# Patient Record
Sex: Female | Born: 1985 | Race: Black or African American | Hispanic: No | Marital: Single | Smoking: Never smoker
Health system: Southern US, Community
[De-identification: ages and names within clinical notes are randomized; demographics above are authoritative.]

## PROBLEM LIST (undated history)

## (undated) ENCOUNTER — Emergency Department (HOSPITAL_COMMUNITY): Admission: EM | Payer: 59

## (undated) DIAGNOSIS — A048 Other specified bacterial intestinal infections: Secondary | ICD-10-CM

## (undated) DIAGNOSIS — K219 Gastro-esophageal reflux disease without esophagitis: Secondary | ICD-10-CM

## (undated) HISTORY — PX: OTHER SURGICAL HISTORY: SHX169

## (undated) HISTORY — DX: Gastro-esophageal reflux disease without esophagitis: K21.9

## (undated) HISTORY — DX: Other specified bacterial intestinal infections: A04.8

---

## 2010-12-11 ENCOUNTER — Telehealth: Payer: Self-pay | Admitting: Gastroenterology

## 2010-12-11 ENCOUNTER — Ambulatory Visit: Payer: Self-pay | Admitting: Gastroenterology

## 2010-12-15 NOTE — Telephone Encounter (Signed)
PT WAS A NO SHOW 

## 2010-12-17 NOTE — Telephone Encounter (Signed)
Please send letter to referring provider that pt did not show. Refer if necessary

## 2010-12-19 ENCOUNTER — Encounter: Payer: Self-pay | Admitting: Gastroenterology

## 2010-12-19 NOTE — Telephone Encounter (Signed)
Mailed appt card to patient. OV 12/19 @ 1030 with AS

## 2010-12-31 ENCOUNTER — Ambulatory Visit (INDEPENDENT_AMBULATORY_CARE_PROVIDER_SITE_OTHER): Payer: BC Managed Care – PPO | Admitting: Gastroenterology

## 2010-12-31 ENCOUNTER — Encounter: Payer: Self-pay | Admitting: Gastroenterology

## 2010-12-31 DIAGNOSIS — K5900 Constipation, unspecified: Secondary | ICD-10-CM | POA: Insufficient documentation

## 2010-12-31 MED ORDER — PEG-KCL-NACL-NASULF-NA ASC-C 100 G PO SOLR: 1.0000 | Freq: Once | ORAL | Status: DC

## 2010-12-31 NOTE — Assessment & Plan Note (Signed)
25 year old with chronic constipation X 3 years. Now reports lack of BM for 3 weeks. +flatus, physical exam with soft, benign abdomen. Likely constipation is multifactorial; no warning signs such as rectal bleeding, wt loss. Due to sexually active lifestyle without protection, will obtain baseline pregnancy test. If negative, proceed with bowel purge using 1/2 dose Moviprep. Repeat X 1 if necessary. Then, will start Amitiza as she has tried OTC agents without much result. Amitiza 8 mcg BID samples provided. Discussed with pt importance of pregnancy avoidance.  Obtain TSH, pregnancy screen Moviprep if negative for initial bowel purge Amitiza for management of constipation the day after bowel purge completed 3 mos f/u or sooner if necessary Obtain labs from [facility] for our records.

## 2010-12-31 NOTE — Progress Notes (Signed)
Primary Care Physician:  Ernestina Penna, MD Primary Gastroenterologist:  Dr. Darrick Penna  Chief Complaint  Patient presents with  . Constipation    has not use the bathroom in 3 wks    HPI:   Alexandra Castillo is a pleasant 25 year old female who is self-referred secondary to constipation. She reports chronic constipation for the past 3 years ago, around the time her baby was born. She states normally will take suppositories or enemas in the past with decent results. However, she has not been able to have a productive BM in 3 weeks. She did note 2 small hard balls the other day. Complains of lower abdominal and lower back discomfort. Denies any melena or hematochezia. Feels bloated and full. States she went to [facility] ED 3 mos ago with constipation. Xray performed, requested results. Denies any wt loss or lack of appetite. +flatus.  As of note, sexually active, does not use protection. Does have regular periods, the 6th of each month.    Past Medical History  Diagnosis Date  . Constipation     Past Surgical History  Procedure Date  . None     Current Outpatient Prescriptions  Medication Sig Dispense Refill  . peg 3350 powder (MOVIPREP) 100 G SOLR Take 1 kit (100 g total) by mouth once. As directed Please purchase 1 Fleets enema to use with the prep  1 kit  0    Allergies as of 12/31/2010  . (No Known Allergies)    Family History  Problem Relation Age of Onset  . Colon cancer Neg Hx     History   Social History  . Marital Status: Single    Spouse Name: N/A    Number of Children: N/A  . Years of Education: N/A   Occupational History  . Not on file.   Social History Main Topics  . Smoking status: Never Smoker   . Smokeless tobacco: Not on file  . Alcohol Use: Yes     socially  . Drug Use: No  . Sexually Active: Yes    Birth Control/ Protection: None     Review of Systems: Gen: Denies any fever, chills, fatigue, weight loss, lack of appetite.  CV: Denies chest pain,  heart palpitations, peripheral edema, syncope.  Resp: Denies shortness of breath at rest or with exertion. Denies wheezing or cough.  GI: Denies dysphagia or odynophagia. Denies jaundice, hematemesis, fecal incontinence. GU : Denies urinary burning, urinary frequency, urinary hesitancy MS: Denies joint pain, muscle weakness, cramps, or limitation of movement.  Derm: Denies rash, itching, dry skin Psych: Denies depression, anxiety, memory loss, and confusion Heme: Denies bruising, bleeding, and enlarged lymph nodes.  Physical Exam: BP 117/78  Pulse 84  Temp(Src) 98 F (36.7 C) (Temporal)  Ht 5\' 6"  (1.676 m)  Wt 198 lb 12.8 oz (90.175 kg)  BMI 32.09 kg/m2  LMP 12/18/2010 General:   Alert and oriented. Pleasant and cooperative. Well-nourished and well-developed.  Head:  Normocephalic and atraumatic. Eyes:  Without icterus, sclera clear and conjunctiva pink.  Ears:  Normal auditory acuity. Nose:  No deformity, discharge,  or lesions. Mouth:  No deformity or lesions, oral mucosa pink.  Neck:  Supple, without mass or thyromegaly. Lungs:  Clear to auscultation bilaterally. No wheezes, rales, or rhonchi. No distress.  Heart:  S1, S2 present without murmurs appreciated.  Abdomen:  +BS, soft, non-tender and non-distended. No HSM noted. No guarding or rebound. No masses appreciated.  Rectal:  Deferred  Msk:  Symmetrical without gross deformities. Normal  posture. Extremities:  Without clubbing or edema. Neurologic:  Alert and  oriented x4;  grossly normal neurologically. Skin:  Intact without significant lesions or rashes. Cervical Nodes:  No significant cervical adenopathy. Psych:  Alert and cooperative. Normal mood and affect.

## 2010-12-31 NOTE — Progress Notes (Signed)
Quick Note:  Negative pregnancy. Called pt to inform she may start the bowel purge. Left message. ______

## 2010-12-31 NOTE — Patient Instructions (Signed)
Please have labs drawn and urine pregnancy completed. DO NOT START ANY MEDICATIONS UNTIL WE CALL YOU WITH THESE RESULTS.   After we call you and give results, you may take a 1/2 dose of the prep. This means empty 1 packet of A and 1 packet of B into the 8oz container provided. Drink this afternoon/evening (after we say it's ok). If needed, you can repeat this once.  Leave a day in-between the prep and starting the new medication called Amitiza. This is twice a day, TAKEN WITH FOOD to avoid nausea. AVOID PREGNANCY, use protection.   We will see you back in 6 weeks or sooner if needed.   Please contact us if you do not have results or any issues with the prep.

## 2011-01-01 NOTE — Progress Notes (Signed)
REVIEWED.  

## 2011-01-01 NOTE — Progress Notes (Signed)
Spoke with pt. Bowel prep successful. No abdominal pain, N/V. Feels significantly improved. Starting Amitiza tomorrow. Follow-up as planned.

## 2011-01-01 NOTE — Progress Notes (Signed)
Quick Note:  Contacted pt again. She is aware. Excellent results with 1/2 dose moviprep. To begin Amitiza. ______

## 2011-01-01 NOTE — Progress Notes (Signed)
Cc to Dr. Ralph Dowdy

## 2011-02-11 ENCOUNTER — Ambulatory Visit: Payer: BC Managed Care – PPO | Admitting: Gastroenterology

## 2011-02-11 ENCOUNTER — Telehealth: Payer: Self-pay | Admitting: Gastroenterology

## 2011-02-11 NOTE — Telephone Encounter (Signed)
Pt was a no show

## 2011-05-14 NOTE — Telephone Encounter (Signed)
First no-show. Needs f/u appt for constipation.

## 2011-05-20 ENCOUNTER — Encounter: Payer: Self-pay | Admitting: Gastroenterology

## 2011-05-20 NOTE — Telephone Encounter (Signed)
Mailed letter to patient to call office to set up OV °

## 2016-03-07 ENCOUNTER — Encounter (HOSPITAL_COMMUNITY): Payer: Self-pay | Admitting: Emergency Medicine

## 2016-03-07 ENCOUNTER — Emergency Department (HOSPITAL_COMMUNITY)
Admission: EM | Admit: 2016-03-07 | Discharge: 2016-03-07 | Disposition: A | Discharge status: Home / Self Care | Payer: Medicaid Other | Attending: Emergency Medicine | Admitting: Emergency Medicine

## 2016-03-07 DIAGNOSIS — Z202 Contact with and (suspected) exposure to infections with a predominantly sexual mode of transmission: Secondary | ICD-10-CM | POA: Diagnosis not present

## 2016-03-07 DIAGNOSIS — B373 Candidiasis of vulva and vagina: Secondary | ICD-10-CM | POA: Diagnosis not present

## 2016-03-07 DIAGNOSIS — N898 Other specified noninflammatory disorders of vagina: Secondary | ICD-10-CM | POA: Diagnosis present

## 2016-03-07 DIAGNOSIS — B3731 Acute candidiasis of vulva and vagina: Secondary | ICD-10-CM

## 2016-03-07 LAB — URINALYSIS, ROUTINE W REFLEX MICROSCOPIC
BILIRUBIN URINE: NEGATIVE
Glucose, UA: NEGATIVE mg/dL
HGB URINE DIPSTICK: NEGATIVE
Ketones, ur: NEGATIVE mg/dL
Leukocytes, UA: NEGATIVE
NITRITE: NEGATIVE
PROTEIN: NEGATIVE mg/dL
Specific Gravity, Urine: 1.025 (ref 1.005–1.030)
pH: 7 (ref 5.0–8.0)

## 2016-03-07 LAB — WET PREP, GENITAL
Clue Cells Wet Prep HPF POC: NONE SEEN
SPERM: NONE SEEN
Trich, Wet Prep: NONE SEEN

## 2016-03-07 LAB — PREGNANCY, URINE: PREG TEST UR: NEGATIVE

## 2016-03-07 MED ORDER — TERCONAZOLE 0.4 % VA CREA: 1.0000 | TOPICAL_CREAM | Freq: Every day | VAGINAL | 0 refills | Status: DC

## 2016-03-07 MED ORDER — AZITHROMYCIN 250 MG PO TABS
1000.0000 mg | ORAL_TABLET | Freq: Once | ORAL | Status: AC
Start: 2016-03-07 — End: 2016-03-07
  Administered 2016-03-07: 1000 mg via ORAL
  Filled 2016-03-07: qty 4

## 2016-03-07 MED ORDER — CEFTRIAXONE SODIUM 250 MG IJ SOLR
250.0000 mg | Freq: Once | INTRAMUSCULAR | Status: AC
  Administered 2016-03-07: 250 mg via INTRAMUSCULAR
  Filled 2016-03-07: qty 250

## 2016-03-07 NOTE — ED Triage Notes (Signed)
Pt reports vaginal itching and rash x 1 month. States she went to PCP and was diagnosed with yeast infection. States she was unable to get prescription filled due to finances. States she also had unprotected sex about 3 weeks ago and is concerned about STDs.

## 2016-03-08 LAB — HIV ANTIBODY (ROUTINE TESTING W REFLEX): HIV Screen 4th Generation wRfx: NONREACTIVE

## 2016-03-08 LAB — RPR: RPR Ser Ql: NONREACTIVE

## 2016-03-09 LAB — GC/CHLAMYDIA PROBE AMP (~~LOC~~) NOT AT ARMC
CHLAMYDIA, DNA PROBE: NEGATIVE
NEISSERIA GONORRHEA: NEGATIVE

## 2016-03-09 NOTE — ED Provider Notes (Signed)
[facility]-EMERGENCY DEPT Provider Note   CSN: [id number] Arrival date & time: 03/07/16  [id number]     History   Chief Complaint Chief Complaint  Patient presents with  . Vaginal Itching    HPI Alexandra Castillo is a [age] y.o. female presenting with persistent vaginal itching with known yeast vaginitis which was diagnosed one month ago.  She was unable to afford the medicine prescribed so remained untreated.  She also endorses having a new female partner about 3 weeks ago, having unprotected sex and would like std screening.  She denies abdominal or pelvic pain, fevers, chills, rash, or any change in her vaginal discharge.  She has found no alleviators.  The history is provided by the patient.    Past Medical History:  Diagnosis Date  . Constipation     Patient Active Problem List   Diagnosis Date Noted  . Constipation 12/31/2010    Past Surgical History:  Procedure Laterality Date  . None      OB History    No data available       Home Medications    Prior to Admission medications   Medication Sig Start Date End Date Taking? Authorizing Provider  hydrocortisone cream 1 % Apply 1 application topically daily as needed for itching.   Yes Historical Provider, MD  terconazole (TERAZOL 7) 0.4 % vaginal cream Place 1 applicator vaginally at bedtime. 03/07/16   Burgess AmorJulie Kaleea Penner, PA-C    Family History Family History  Problem Relation Age of Onset  . Colon cancer Neg Hx     Social History Social History  Substance Use Topics  . Smoking status: Never Smoker  . Smokeless tobacco: Never Used  . Alcohol use Yes     Comment: socially     Allergies   Hydrocodeine [dihydrocodeine]   Review of Systems Review of Systems  Constitutional: Negative for fever.  HENT: Negative for congestion and sore throat.   Eyes: Negative.   Respiratory: Negative for chest tightness and shortness of breath.   Cardiovascular: Negative for chest pain.  Gastrointestinal: Negative for abdominal pain,  nausea and vomiting.  Genitourinary: Positive for vaginal discharge. Negative for dysuria, flank pain, genital sores, pelvic pain and vaginal pain.  Musculoskeletal: Negative for arthralgias, joint swelling and neck pain.  Skin: Negative.  Negative for rash and wound.  Neurological: Negative for dizziness, weakness, light-headedness, numbness and headaches.  Psychiatric/Behavioral: Negative.      Physical Exam Updated Vital Signs BP 125/77 (BP Location: Left Arm)   Pulse 101   Temp 98 F (36.7 C) (Oral)   Resp 21   Ht 5\' 6"  (1.676 m)   Wt 96.2 kg   LMP 02/11/2016   SpO2 100%   BMI 34.22 kg/m   Physical Exam  Constitutional: She appears well-developed and well-nourished.  HENT:  Head: Normocephalic and atraumatic.  Eyes: Conjunctivae are normal.  Neck: Normal range of motion.  Cardiovascular: Normal rate, regular rhythm, normal heart sounds and intact distal pulses.   Pulmonary/Chest: Effort normal and breath sounds normal. She has no wheezes.  Abdominal: Soft. Bowel sounds are normal. There is no tenderness. There is no guarding.  Genitourinary: There is rash and tenderness on the right labia. There is rash and tenderness on the left labia. Cervix exhibits no motion tenderness and no discharge. Right adnexum displays no mass and no tenderness. Left adnexum displays no mass and no tenderness. No erythema or tenderness in the vagina. Vaginal discharge found.  Musculoskeletal: Normal range of motion.  Neurological:  She is alert.  Skin: Skin is warm and dry.  Psychiatric: She has a normal mood and affect.  Nursing note and vitals reviewed.    ED Treatments / Results  Labs (all labs ordered are listed, but only abnormal results are displayed) Labs Reviewed  WET PREP, GENITAL - Abnormal; Notable for the following:       Result Value   Yeast Wet Prep HPF POC PRESENT (*)    WBC, Wet Prep HPF POC MODERATE (*)    All other components within normal limits  URINALYSIS, ROUTINE W  REFLEX MICROSCOPIC  PREGNANCY, URINE  HIV ANTIBODY (ROUTINE TESTING)  RPR  GC/CHLAMYDIA PROBE AMP ([place]) NOT AT [facility]    EKG  EKG Interpretation None       Radiology No results found.  Procedures Procedures (including critical care time)  Medications Ordered in ED Medications  cefTRIAXone (ROCEPHIN) injection 250 mg (250 mg Intramuscular Given 03/07/16 1054)  azithromycin (ZITHROMAX) tablet 1,000 mg (1,000 mg Oral Given 03/07/16 1052)     Initial Impression / Assessment and Plan / ED Course  I have reviewed the triage vital signs and the nursing notes.  Pertinent labs & imaging results that were available during my care of the patient were reviewed by me and considered in my medical decision making (see chart for details).     Pt would like to be tx for gc/chlamydia rather than waiting for cx.  Rocephin/zithromax given here.  terazol cream prescribed which is covered by medicaid. Pt aware cx pending. Referrals given for pcp, prn f/u anticipated.  Final Clinical Impressions(s) / ED Diagnoses   Final diagnoses:  Yeast vaginitis  STD exposure    New Prescriptions Discharge Medication List as of 03/07/2016 10:36 AM    START taking these medications   Details  terconazole (TERAZOL 7) 0.4 % vaginal cream Place 1 applicator vaginally at bedtime., Starting Sat 03/07/2016, Print         Burgess Amor, PA-C 03/09/16 2227    Bethann Berkshire, MD 03/10/16 [phone]

## 2016-04-21 ENCOUNTER — Other Ambulatory Visit: Payer: Self-pay | Admitting: Obstetrics & Gynecology

## 2016-04-22 LAB — CYTOLOGY - PAP

## 2017-02-23 ENCOUNTER — Encounter: Payer: Self-pay | Admitting: Pediatrics

## 2017-02-23 ENCOUNTER — Ambulatory Visit: Payer: Self-pay | Admitting: Pediatrics

## 2018-01-06 ENCOUNTER — Encounter: Payer: Self-pay | Admitting: Gastroenterology

## 2018-01-28 ENCOUNTER — Ambulatory Visit: Payer: Self-pay | Admitting: Gastroenterology

## 2018-03-29 ENCOUNTER — Other Ambulatory Visit: Payer: Self-pay | Admitting: Physician Assistant

## 2018-03-29 DIAGNOSIS — Z1231 Encounter for screening mammogram for malignant neoplasm of breast: Secondary | ICD-10-CM

## 2018-07-27 ENCOUNTER — Other Ambulatory Visit: Payer: Self-pay

## 2018-07-27 ENCOUNTER — Emergency Department (HOSPITAL_COMMUNITY): Admission: EM | Admit: 2018-07-27 | Discharge: 2018-07-27 | Discharge status: Against Medical Advice | Payer: 59

## 2018-07-27 NOTE — ED Notes (Signed)
Patient advised registration that they were leaving.  

## 2019-01-02 ENCOUNTER — Other Ambulatory Visit: Payer: 59

## 2019-05-01 ENCOUNTER — Encounter (HOSPITAL_COMMUNITY): Payer: Self-pay | Admitting: Emergency Medicine

## 2019-05-01 ENCOUNTER — Other Ambulatory Visit: Payer: Self-pay

## 2019-05-01 ENCOUNTER — Emergency Department (HOSPITAL_COMMUNITY)
Admission: EM | Admit: 2019-05-01 | Discharge: 2019-05-02 | Disposition: A | Discharge status: Home / Self Care | Payer: 59 | Attending: Emergency Medicine | Admitting: Emergency Medicine

## 2019-05-01 ENCOUNTER — Emergency Department (HOSPITAL_COMMUNITY): Payer: 59

## 2019-05-01 DIAGNOSIS — R110 Nausea: Secondary | ICD-10-CM | POA: Diagnosis not present

## 2019-05-01 DIAGNOSIS — R0602 Shortness of breath: Secondary | ICD-10-CM

## 2019-05-01 DIAGNOSIS — N200 Calculus of kidney: Secondary | ICD-10-CM | POA: Diagnosis not present

## 2019-05-01 DIAGNOSIS — N1330 Unspecified hydronephrosis: Secondary | ICD-10-CM | POA: Diagnosis not present

## 2019-05-01 DIAGNOSIS — R1032 Left lower quadrant pain: Secondary | ICD-10-CM | POA: Diagnosis present

## 2019-05-01 DIAGNOSIS — Z20822 Contact with and (suspected) exposure to covid-19: Secondary | ICD-10-CM | POA: Insufficient documentation

## 2019-05-01 DIAGNOSIS — R350 Frequency of micturition: Secondary | ICD-10-CM | POA: Diagnosis not present

## 2019-05-01 MED ORDER — SODIUM CHLORIDE 0.9% FLUSH: 3.0000 mL | Freq: Once | INTRAVENOUS | Status: DC

## 2019-05-01 NOTE — ED Triage Notes (Signed)
Pt reports lower back pain, generalized body aches, headache, fever and generaly feels bad.  OTC are not giving her relief

## 2019-05-02 LAB — CBC WITH DIFFERENTIAL/PLATELET
Abs Immature Granulocytes: 0.06 10*3/uL (ref 0.00–0.07)
Basophils Absolute: 0.1 10*3/uL (ref 0.0–0.1)
Basophils Relative: 1 %
Eosinophils Absolute: 0.4 10*3/uL (ref 0.0–0.5)
Eosinophils Relative: 3 %
HCT: 40 % (ref 36.0–46.0)
Hemoglobin: 13 g/dL (ref 12.0–15.0)
Immature Granulocytes: 1 %
Lymphocytes Relative: 18 %
Lymphs Abs: 2.4 10*3/uL (ref 0.7–4.0)
MCH: 31.6 pg (ref 26.0–34.0)
MCHC: 32.5 g/dL (ref 30.0–36.0)
MCV: 97.3 fL (ref 80.0–100.0)
Monocytes Absolute: 0.8 10*3/uL (ref 0.1–1.0)
Monocytes Relative: 6 %
Neutro Abs: 9.4 10*3/uL — ABNORMAL HIGH (ref 1.7–7.7)
Neutrophils Relative %: 71 %
Platelets: 222 10*3/uL (ref 150–400)
RBC: 4.11 MIL/uL (ref 3.87–5.11)
RDW: 12.6 % (ref 11.5–15.5)
WBC: 13.1 10*3/uL — ABNORMAL HIGH (ref 4.0–10.5)
nRBC: 0 % (ref 0.0–0.2)

## 2019-05-02 LAB — URINALYSIS, ROUTINE W REFLEX MICROSCOPIC
Bacteria, UA: NONE SEEN
Bilirubin Urine: NEGATIVE
Glucose, UA: NEGATIVE mg/dL
Ketones, ur: NEGATIVE mg/dL
Leukocytes,Ua: NEGATIVE
Nitrite: NEGATIVE
Protein, ur: NEGATIVE mg/dL
RBC / HPF: >50 RBC/hpf — ABNORMAL HIGH (ref 0–5)
Specific Gravity, Urine: 1.019 (ref 1.005–1.030)
pH: 7 (ref 5.0–8.0)

## 2019-05-02 LAB — COMPREHENSIVE METABOLIC PANEL
ALT: 23 U/L (ref 0–44)
AST: 22 U/L (ref 15–41)
Albumin: 3.7 g/dL (ref 3.5–5.0)
Alkaline Phosphatase: 97 U/L (ref 38–126)
Anion gap: 7 (ref 5–15)
BUN: 9 mg/dL (ref 6–20)
CO2: 29 mmol/L (ref 22–32)
Calcium: 9 mg/dL (ref 8.9–10.3)
Chloride: 103 mmol/L (ref 98–111)
Creatinine, Ser: 0.66 mg/dL (ref 0.44–1.00)
GFR calc Af Amer: >60 mL/min (ref >60)
GFR calc non Af Amer: >60 mL/min (ref >60)
Glucose, Bld: 106 mg/dL — ABNORMAL HIGH (ref 70–99)
Potassium: 3.7 mmol/L (ref 3.5–5.1)
Sodium: 139 mmol/L (ref 135–145)
Total Bilirubin: 0.5 mg/dL (ref 0.3–1.2)
Total Protein: 6.7 g/dL (ref 6.5–8.1)

## 2019-05-02 LAB — I-STAT BETA HCG BLOOD, ED (MC, WL, AP ONLY): I-stat hCG, quantitative: <5 m[IU]/mL (ref <5)

## 2019-05-02 LAB — POC SARS CORONAVIRUS 2 AG -  ED: SARS Coronavirus 2 Ag: NEGATIVE

## 2019-05-02 MED ORDER — NAPROXEN 500 MG PO TABS: 500.0000 mg | ORAL_TABLET | Freq: Two times a day (BID) | ORAL | 0 refills | Status: DC

## 2019-05-02 MED ORDER — OXYCODONE HCL 5 MG PO TABS
5.0000 mg | ORAL_TABLET | Freq: Once | ORAL | Status: AC
  Administered 2019-05-02: 5 mg via ORAL
  Filled 2019-05-02: qty 1

## 2019-05-02 MED ORDER — OXYCODONE HCL 5 MG PO TABS: 5.0000 mg | ORAL_TABLET | ORAL | 0 refills | Status: DC | PRN

## 2019-05-02 NOTE — Discharge Instructions (Addendum)
You were evaluated in the Emergency Department and after careful evaluation, we did not find any emergent condition requiring admission or further testing in the hospital.  Your exam/testing today was overall reassuring.  Your symptoms seem to be due to a kidney stone.  You should be able to pass the stone on your own and your pain should go away.  Please use the Naprosyn anti-inflammatory medication as directed.  For more significant pain you can use the oxycodone medicine.  Please use caution as this medication can cause drowsiness.  If your pain is still present in 1 week, you will need to be reevaluated either in the emergency department or by a urologist.  Please return to the Emergency Department if you experience any worsening of your condition.  We encourage you to follow up with a primary care provider.  Thank you for allowing [place] to be a part of your care.

## 2019-05-02 NOTE — ED Notes (Signed)
Pt here in waiting room now. Placed back in waiting room in epic.

## 2019-05-02 NOTE — ED Notes (Signed)
Reviewed discharge instructions with patient. Discussed prescription medications and follow up care. Pt verbalized understanding of discharge instructions, no further questions at this time. Pt given strainer for urine and work note.

## 2019-05-02 NOTE — ED Provider Notes (Signed)
[facility]-EMERGENCY DEPT [facility] Emergency Department Provider Note MRN:  [id number]  Arrival date & time: 05/02/19     Chief Complaint   Back pain History of Present Illness   Alexandra Castillo is a 34 y.o. year-old female with no pertinent past medical history presenting to the ED with chief complaint of back pain.  Left backslash flank pain for the past 3 to 4 days.  Had to take the past 2 days off of work because of the severe pain.  Associated with some nausea.  Also having some frequent urination.  Denies dysuria, no other bowel or bladder dysfunction, no numbness or weakness, no trauma.  Feeling generally unwell with malaise, chills, denies chest pain or shortness of breath.  Review of Systems  A complete 10 system review of systems was obtained and all systems are negative except as noted in the HPI and PMH.   Patient's Health History    Past Medical History:  Diagnosis Date  . Constipation     Past Surgical History:  Procedure Laterality Date  . None      Family History  Problem Relation Age of Onset  . Colon cancer Neg Hx     Social History   Socioeconomic History  . Marital status: Single    Spouse name: Not on file  . Number of children: Not on file  . Years of education: Not on file  . Highest education level: Not on file  Occupational History  . Not on file  Tobacco Use  . Smoking status: Never Smoker  . Smokeless tobacco: Never Used  Substance and Sexual Activity  . Alcohol use: Yes    Comment: socially  . Drug use: No  . Sexual activity: Yes    Birth control/protection: None  Other Topics Concern  . Not on file  Social History Narrative  . Not on file   Social Determinants of Health   Financial Resource Strain:   . Difficulty of Paying Living Expenses:   Food Insecurity:   . Worried About Programme researcher, broadcasting/film/video in the Last Year:   . Barista in the Last Year:   Transportation Needs:   . Freight forwarder (Medical):   Marland Kitchen Lack  of Transportation (Non-Medical):   Physical Activity:   . Days of Exercise per Week:   . Minutes of Exercise per Session:   Stress:   . Feeling of Stress :   Social Connections:   . Frequency of Communication with Friends and Family:   . Frequency of Social Gatherings with Friends and Family:   . Attends Religious Services:   . Active Member of Clubs or Organizations:   . Attends Banker Meetings:   Marland Kitchen Marital Status:   Intimate Partner Violence:   . Fear of Current or Ex-Partner:   . Emotionally Abused:   Marland Kitchen Physically Abused:   . Sexually Abused:      Physical Exam   Vitals:   05/02/19 0416 05/02/19 0825  BP: 125/69 127/81  Pulse: 92 90  Resp: 16 16  Temp: 98.2 F (36.8 C) 98.1 F (36.7 C)  SpO2: 100% 100%    CONSTITUTIONAL: Well-appearing, NAD NEURO:  Alert and oriented x 3, no focal deficits EYES:  eyes equal and reactive ENT/NECK:  no LAD, no JVD CARDIO: Regular rate, well-perfused, normal S1 and S2 PULM:  CTAB no wheezing or rhonchi GI/GU:  normal bowel sounds, non-distended, non-tender; mild left CVA tenderness MSK/SPINE:  No gross deformities, no  edema SKIN:  no rash, atraumatic PSYCH:  Appropriate speech and behavior  *Additional and/or pertinent findings included in MDM below  Diagnostic and Interventional Summary    EKG Interpretation  Date/Time:    Ventricular Rate:    PR Interval:    QRS Duration:   QT Interval:    QTC Calculation:   R Axis:     Text Interpretation:        Labs Reviewed  COMPREHENSIVE METABOLIC PANEL - Abnormal; Notable for the following components:      Result Value   Glucose, Bld 106 (*)    All other components within normal limits  CBC WITH DIFFERENTIAL/PLATELET - Abnormal; Notable for the following components:   WBC 13.1 (*)    Neutro Abs 9.4 (*)    All other components within normal limits  URINALYSIS, ROUTINE W REFLEX MICROSCOPIC - Abnormal; Notable for the following components:   APPearance HAZY (*)     Hgb urine dipstick LARGE (*)    RBC / HPF >50 (*)    All other components within normal limits  I-STAT BETA HCG BLOOD, ED ([facility], [facility], [facility] ONLY)  POC SARS CORONAVIRUS 2 AG -  ED    DG Chest Port 1 View  Final Result      Medications  sodium chloride flush (NS) 0.9 % injection 3 mL (3 mLs Intravenous Not Given 05/02/19 0826)  oxyCODONE (Oxy IR/ROXICODONE) immediate release tablet 5 mg (has no administration in time range)     Procedures  /  Critical Care Ultrasound ED Renal  Date/Time: 05/02/2019 8:50 AM Performed by: Maudie Flakes, MD Authorized by: Maudie Flakes, MD   Procedure details:    Indications comment:  Flank pain, hematuria   Technique:  L kidney and R kidneyImages: archived Left kidney findings:    Hydronephrosis: mild   Right kidney findings:    Hydronephrosis: none      ED Course and Medical Decision Making  I have reviewed the triage vital signs, the nursing notes, and pertinent available records from the EMR.  Listed above are laboratory and imaging tests that I personally ordered, reviewed, and interpreted and then considered in my medical decision making (see below for details).      Left flank pain with hematuria found on urinalysis.  Patient's menstrual cycle ended 2 days ago, not currently bleeding.  This raises suspicion for kidney stone.  Bedside ultrasound reveals mild hydronephrosis.  Given patient's age and likelihood of stone given the current data, CT scan avoided today.  Patient advised to return to the emergency department or see a urologist if pain continues for 1 week.  Labs reassuring, no UTI, appropriate for discharge.    Barth Kirks. [place] Small, [place] [email or web address]  Final Clinical Impressions(s) / ED Diagnoses     ICD-10-CM   1. Kidney stone  N20.0   2. SOB (shortness of breath)  R06.02 DG Chest Port 1 View    DG Chest Port 1 View    ED Discharge Orders         Ordered     naproxen (NAPROSYN) 500 MG tablet  2 times daily     05/02/19 0848    oxyCODONE (ROXICODONE) 5 MG immediate release tablet  Every 4 hours PRN     05/02/19 0848           Discharge Instructions Discussed with and Provided to Patient:     Discharge Instructions  You were evaluated in the Emergency Department and after careful evaluation, we did not find any emergent condition requiring admission or further testing in the hospital.  Your exam/testing today was overall reassuring.  Your symptoms seem to be due to a kidney stone.  You should be able to pass the stone on your own and your pain should go away.  Please use the Naprosyn anti-inflammatory medication as directed.  For more significant pain you can use the oxycodone medicine.  Please use caution as this medication can cause drowsiness.  If your pain is still present in 1 week, you will need to be reevaluated either in the emergency department or by a urologist.  Please return to the Emergency Department if you experience any worsening of your condition.  We encourage you to follow up with a primary care provider.  Thank you for allowing [place] to be a part of your care.       Sabas Sous, MD 05/02/19 [phone]

## 2019-05-02 NOTE — ED Notes (Signed)
Pt cant be found in waiting room. Called several times.

## 2019-05-16 ENCOUNTER — Ambulatory Visit (INDEPENDENT_AMBULATORY_CARE_PROVIDER_SITE_OTHER): Payer: 59 | Admitting: Obstetrics & Gynecology

## 2019-05-16 ENCOUNTER — Other Ambulatory Visit: Payer: Self-pay

## 2019-05-16 ENCOUNTER — Encounter: Payer: Self-pay | Admitting: Obstetrics & Gynecology

## 2019-05-16 VITALS — BP 130/75 | HR 104 | Ht 66.0 in | Wt 230.0 lb

## 2019-05-16 DIAGNOSIS — Z3202 Encounter for pregnancy test, result negative: Secondary | ICD-10-CM

## 2019-05-16 DIAGNOSIS — M545 Low back pain, unspecified: Secondary | ICD-10-CM

## 2019-05-16 LAB — POCT URINALYSIS DIPSTICK
Blood, UA: NEGATIVE
Glucose, UA: NEGATIVE
Ketones, UA: NEGATIVE
Leukocytes, UA: NEGATIVE
Nitrite, UA: NEGATIVE
Protein, UA: NEGATIVE

## 2019-05-16 LAB — POCT URINE PREGNANCY: Preg Test, Ur: NEGATIVE

## 2019-05-16 MED ORDER — CYCLOBENZAPRINE HCL 10 MG PO TABS: 10.0000 mg | ORAL_TABLET | Freq: Three times a day (TID) | ORAL | 1 refills | Status: DC | PRN

## 2019-05-16 NOTE — Progress Notes (Signed)
Chief Complaint  Patient presents with  . Follow-up    ER visit 05/02/19      34 y.o. G4P0 Patient's last menstrual period was 04/25/2018 (exact date). The current method of family planning is Essure.  Outpatient Encounter Medications as of 05/16/2019  Medication Sig  . cyclobenzaprine (FLEXERIL) 10 MG tablet Take 1 tablet (10 mg total) by mouth every 8 (eight) hours as needed for muscle spasms.  . [DISCONTINUED] naproxen (NAPROSYN) 500 MG tablet Take 1 tablet (500 mg total) by mouth 2 (two) times daily.  . [DISCONTINUED] oxyCODONE (ROXICODONE) 5 MG immediate release tablet Take 1 tablet (5 mg total) by mouth every 4 (four) hours as needed for severe pain.  . [DISCONTINUED] terconazole (TERAZOL 7) 0.4 % vaginal cream Place 1 applicator vaginally at bedtime. (Patient not taking: Reported on 05/02/2019)   No facility-administered encounter medications on file as of 05/16/2019.    Subjective Pt was seen in ED 4/20 for low back pain Work up was negative Including negative pregnancy test ED physician did a bedside sonogram and noted mild left hydronephrosis no stone Not 100% sure how patient got to our office and neither is patient, there was a passing mention of "possible tubal pregnancy" Today her pregnancy test remains negative Just judging by the way the patient is moving it appears she has garden-variety low back pain She denies any sciatica type symptoms down either leg She denies any acute trauma or acute changes in her condition just a gradual worsening which culminated with " her back freezing up on her" Patient denies any discharge or irregular bleeding Past Medical History:  Diagnosis Date  . Constipation     Past Surgical History:  Procedure Laterality Date  . None      OB History    Gravida  4   Para      Term      Preterm      AB      Living  4     SAB      TAB      Ectopic      Multiple      Live Births  4           Allergies    Allergen Reactions  . Hydrocodeine [Dihydrocodeine] Itching    Social History   Socioeconomic History  . Marital status: Single    Spouse name: Not on file  . Number of children: Not on file  . Years of education: Not on file  . Highest education level: Not on file  Occupational History  . Not on file  Tobacco Use  . Smoking status: Never Smoker  . Smokeless tobacco: Never Used  Substance and Sexual Activity  . Alcohol use: Yes    Comment: socially  . Drug use: No  . Sexual activity: Yes    Birth control/protection: Surgical  Other Topics Concern  . Not on file  Social History Narrative  . Not on file   Social Determinants of Health   Financial Resource Strain: Low Risk   . Difficulty of Paying Living Expenses: Not very hard  Food Insecurity: No Food Insecurity  . Worried About Charity fundraiser in the Last Year: Never true  . Ran Out of Food in the Last Year: Never true  Transportation Needs: No Transportation Needs  . Lack of Transportation (Medical): No  . Lack of Transportation (Non-Medical): No  Physical Activity: Insufficiently Active  . Days of Exercise  per Week: 2 days  . Minutes of Exercise per Session: 10 min  Stress: No Stress Concern Present  . Feeling of Stress : Only a little  Social Connections: Somewhat Isolated  . Frequency of Communication with Friends and Family: Once a week  . Frequency of Social Gatherings with Friends and Family: Twice a week  . Attends Religious Services: 1 to 4 times per year  . Active Member of Clubs or Organizations: No  . Attends Banker Meetings: Never  . Marital Status: Never married    Family History  Problem Relation Age of Onset  . Heart failure Mother   . Kidney failure Mother   . Colon cancer Neg Hx     Medications:       Current Outpatient Medications:  .  cyclobenzaprine (FLEXERIL) 10 MG tablet, Take 1 tablet (10 mg total) by mouth every 8 (eight) hours as needed for muscle spasms.,  Disp: 30 tablet, Rfl: 1  Objective Blood pressure 130/75, pulse (!) 104, height 5\' 6"  (1.676 m), weight 230 lb (104.3 kg), last menstrual period 04/25/2018.  General WDWN female NAD Vulva:  normal appearing vulva with no masses, tenderness or lesions Vagina:  normal mucosa, no discharge Cervix:  Normal no lesions Uterus:  normal size, contour, position, consistency, mobility, non-tender Adnexa: ovaries:present,  normal adnexa in size, nontender and no masses  The patient does have pain to palpation and trigger points throughout her low back in the lumbosacral area there is no localizing finding There is no finding consistent with CVA tenderness which would suggest a pyelonephritis or acute nephrolithiasis  Pertinent ROS No burning with urination, frequency or urgency No nausea, vomiting or diarrhea Nor fever chills or other constitutional symptoms   Labs or studies     Impression Diagnoses this Encounter::   ICD-10-CM   1. Low back pain, unspecified back pain laterality, unspecified chronicity, unspecified whether sciatica present  M54.5 POCT Urinalysis Dipstick  2. Urine pregnancy test negative  Z32.02 POCT urine pregnancy    Established relevant diagnosis(es):   Plan/Recommendations: Meds ordered this encounter  Medications  . cyclobenzaprine (FLEXERIL) 10 MG tablet    Sig: Take 1 tablet (10 mg total) by mouth every 8 (eight) hours as needed for muscle spasms.    Dispense:  30 tablet    Refill:  1    Labs or Scans Ordered: Orders Placed This Encounter  Procedures  . POCT Urinalysis Dipstick  . POCT urine pregnancy    Management:: I am recommending a multimodal approach to the management of her back pain consistent with evidence-based medicine: [phone]   >Flexeril prn >recommend weight loss >recommend warm water or cold packs intermittently for local pain relief, not oxycodone >Recommended patient see a  chiropracter, she lives up in [place] >[facility] low back pain handout was given to patient  [email or web address]   Follow up Return if symptoms worsen or fail to improve.        All questions were answered.

## 2020-04-09 DIAGNOSIS — Z7251 High risk heterosexual behavior: Secondary | ICD-10-CM | POA: Diagnosis not present

## 2020-04-11 DIAGNOSIS — B369 Superficial mycosis, unspecified: Secondary | ICD-10-CM | POA: Diagnosis not present

## 2020-04-30 DIAGNOSIS — L739 Follicular disorder, unspecified: Secondary | ICD-10-CM | POA: Diagnosis not present

## 2020-05-02 DIAGNOSIS — N898 Other specified noninflammatory disorders of vagina: Secondary | ICD-10-CM | POA: Diagnosis not present

## 2020-05-02 DIAGNOSIS — Z6838 Body mass index (BMI) 38.0-38.9, adult: Secondary | ICD-10-CM | POA: Diagnosis not present

## 2021-04-02 DIAGNOSIS — R1084 Generalized abdominal pain: Secondary | ICD-10-CM | POA: Diagnosis not present

## 2021-04-02 DIAGNOSIS — Z7951 Long term (current) use of inhaled steroids: Secondary | ICD-10-CM | POA: Diagnosis not present

## 2021-04-02 DIAGNOSIS — Z91040 Latex allergy status: Secondary | ICD-10-CM | POA: Diagnosis not present

## 2021-04-02 DIAGNOSIS — K5904 Chronic idiopathic constipation: Secondary | ICD-10-CM | POA: Diagnosis not present

## 2021-04-02 DIAGNOSIS — K581 Irritable bowel syndrome with constipation: Secondary | ICD-10-CM | POA: Diagnosis not present

## 2021-04-02 DIAGNOSIS — Z885 Allergy status to narcotic agent status: Secondary | ICD-10-CM | POA: Diagnosis not present

## 2021-04-07 ENCOUNTER — Encounter: Payer: Self-pay | Admitting: Physician Assistant

## 2021-04-22 ENCOUNTER — Encounter: Payer: Self-pay | Admitting: Physician Assistant

## 2021-04-22 ENCOUNTER — Ambulatory Visit (INDEPENDENT_AMBULATORY_CARE_PROVIDER_SITE_OTHER): Payer: BC Managed Care – PPO | Admitting: Physician Assistant

## 2021-04-22 ENCOUNTER — Other Ambulatory Visit (INDEPENDENT_AMBULATORY_CARE_PROVIDER_SITE_OTHER): Payer: BC Managed Care – PPO

## 2021-04-22 VITALS — BP 122/86 | HR 86 | Ht 66.0 in | Wt 228.0 lb

## 2021-04-22 DIAGNOSIS — K219 Gastro-esophageal reflux disease without esophagitis: Secondary | ICD-10-CM | POA: Diagnosis not present

## 2021-04-22 DIAGNOSIS — K5904 Chronic idiopathic constipation: Secondary | ICD-10-CM | POA: Diagnosis not present

## 2021-04-22 LAB — CBC WITH DIFFERENTIAL/PLATELET
Basophils Absolute: 0.1 10*3/uL (ref 0.0–0.1)
Basophils Relative: 0.6 % (ref 0.0–3.0)
Eosinophils Absolute: 0.1 10*3/uL (ref 0.0–0.7)
Eosinophils Relative: 1.1 % (ref 0.0–5.0)
HCT: 38.4 % (ref 36.0–46.0)
Hemoglobin: 12.7 g/dL (ref 12.0–15.0)
Lymphocytes Relative: 18.8 % (ref 12.0–46.0)
Lymphs Abs: 1.6 10*3/uL (ref 0.7–4.0)
MCHC: 33.2 g/dL (ref 30.0–36.0)
MCV: 92.8 fl (ref 78.0–100.0)
Monocytes Absolute: 0.5 10*3/uL (ref 0.1–1.0)
Monocytes Relative: 5.9 % (ref 3.0–12.0)
Neutro Abs: 6.3 10*3/uL (ref 1.4–7.7)
Neutrophils Relative %: 73.6 % (ref 43.0–77.0)
Platelets: 219 10*3/uL (ref 150.0–400.0)
RBC: 4.13 Mil/uL (ref 3.87–5.11)
RDW: 14.1 % (ref 11.5–15.5)
WBC: 8.5 10*3/uL (ref 4.0–10.5)

## 2021-04-22 LAB — COMPREHENSIVE METABOLIC PANEL
ALT: 11 U/L (ref 0–35)
AST: 13 U/L (ref 0–37)
Albumin: 4.2 g/dL (ref 3.5–5.2)
Alkaline Phosphatase: 76 U/L (ref 39–117)
BUN: 7 mg/dL (ref 6–23)
CO2: 30 mEq/L (ref 19–32)
Calcium: 9.1 mg/dL (ref 8.4–10.5)
Chloride: 102 mEq/L (ref 96–112)
Creatinine, Ser: 0.68 mg/dL (ref 0.40–1.20)
GFR: 112.45 mL/min (ref >60.00)
Glucose, Bld: 83 mg/dL (ref 70–99)
Potassium: 4 mEq/L (ref 3.5–5.1)
Sodium: 138 mEq/L (ref 135–145)
Total Bilirubin: 0.5 mg/dL (ref 0.2–1.2)
Total Protein: 7 g/dL (ref 6.0–8.3)

## 2021-04-22 LAB — C-REACTIVE PROTEIN: CRP: 1.1 mg/dL (ref 0.5–20.0)

## 2021-04-22 LAB — TSH: TSH: 1.28 u[IU]/mL (ref 0.35–5.50)

## 2021-04-22 MED ORDER — LUBIPROSTONE 24 MCG PO CAPS: 24.0000 ug | ORAL_CAPSULE | Freq: Two times a day (BID) | ORAL | 3 refills | Status: DC

## 2021-04-22 NOTE — Progress Notes (Signed)
? ? ? ?04/22/2021 ?Elise Knobloch ?[id number] ?February 05, 1985 ? ? ?ASSESSMENT AND PLAN:  ? ?Arden was seen today for constipation and abdominal pain. ? ?Chronic idiopathic constipation ?-     CBC with Differential/Platelet; Future ?-     Comprehensive metabolic panel; Future ?-     C-reactive protein; Future ?-     TSH; Future ?-     lubiprostone (AMITIZA) 24 MCG capsule; Take 1 capsule (24 mcg total) by mouth 2 (two) times daily with a meal. ?- Increase fiber/ water intake, decrease caffeine, increase activity level. ?-Will add on Amitza ?- Please go to the hospital if you have severe abdominal pain, vomiting, fever, CP, SOB.  ? ?Gastroesophageal reflux disease without esophagitis ?-     Comprehensive metabolic panel; Future ?-     C-reactive protein; Future ?-     H. pylori antibody, IgG; Future ?Will start the patient on a PPI AFTER H pylori trial ?Lifestyle changes discussed, avoid NSAIDS, ETOH ? ? ?Patient Care Team: ?Patient, No Pcp Per (Inactive) as PCP - General (General Practice) ?Fields, Darleene Cleaver, MD (Inactive) (Gastroenterology) ? ?HISTORY OF PRESENT ILLNESS: ?36 y.o. female referred by No ref. provider found, with a past medical history of kidney stones, constipation, IBS and others listed below presents for evaluation of constipation.  ? ?Went to the ER on 04/02/2021 for constipation.  ?She had KUB that showed normal bowel gas pattern.  ?Moderate stool in ascending and transverse colon.  ? ?She has been on miralax 17 grams once a day, for a week prior to the ER, has continued once daily.  ?Enema has not helped, glycerin suppositories was not helpful.  ?Was on linzess samples which worked well but then with the card the cost was still too expensive.  ?States red meat can make it worse.  ?She has AB bloating, AB cramping. Denies hematochezia.  ?She states she has reflux x 6 months and starting to have some mild dysphagia at times.  ?Denies nausea, vomiting.  ?She has lost 6 lbs, but has stress. 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  ?Has some dizziness with standing.  ?No family history of GB issues.  ?No NSAIDS, rare ETOH.  ? ?External labs and notes reviewed this visit: ?Last labs were from 2021, no recent labs.  ? ?Current Medications:  ? ? ? ? ? ? ?Current Outpatient Medications (Other):  ?  lubiprostone (AMITIZA) 24 MCG capsule, Take 1 capsule (24 mcg total) by mouth 2 (two) times daily with a meal. ? ?Medical History:  ?Past Medical History:  ?Diagnosis Date  ? Constipation   ? ?Allergies:  ?Allergies  ?Allergen Reactions  ? Hydrocodeine [Dihydrocodeine] Itching  ? Latex   ?  ? ?Surgical History:  ?She  has a past surgical history that includes None. ?Family History:  ?Her family history includes Heart failure in her mother; Kidney failure in her mother. ?Social History:  ? reports that she has never smoked. She has never used smokeless tobacco. She reports current alcohol use. She reports that she does not use drugs. ? ?REVIEW OF SYSTEMS  : All other systems reviewed and negative except where noted in the History of Present Illness. ? ? ?PHYSICAL EXAM: ?BP 122/86   Pulse 86   Ht 5\' 6"  (1.676 m)   Wt 228 lb (103.4 kg)   SpO2 98%   BMI 36.80 kg/m?  ?General:   Pleasant, well developed female in no acute distress ?Head:  Normocephalic and atraumatic. ?Eyes: sclerae anicteric,conjunctive pink  ?  Heart:  regular rate and rhythm ?Pulm: Clear anteriorly; no wheezing ?Abdomen:   Soft, Obese AB, Sluggish bowel sounds. mild tenderness in the epigastrium and in the lower abdomen. Without guarding and Without rebound, No organomegaly appreciated. ?Extremities:  Without edema. ?Msk:  Symmetrical without gross deformities. Peripheral pulses intact.  ?Neurologic:  Alert and  oriented x4;  No focal deficits.  ?Skin:   Dry and intact without significant lesions or rashes. ?Psychiatric: Cooperative. Normal mood and affect. ? ? ? ?Doree Albee, PA-C ?3:07 PM ? ? ?

## 2021-04-22 NOTE — Patient Instructions (Addendum)
Your provider has requested that you go to the basement level for lab work before leaving today. Press "B" on the elevator. The lab is located at the first door on the left as you exit the elevator. ? ? ?Recommend starting on a fiber supplement, can try metamucil first but if this causes gas/bloating switch to benefiber ?Take with fiber with with a full 8 oz glass of water once a day. This can take 1 month to start helping, so try for at least one month.  ?Recommend increasing water and activity.  ?Get a squatty potty to use at home or try a stool, goal is to get your knees above your hips during a bowel movement. ? ?Amitiza ?You can have a loose stool if you eat a high-fat breakfast. ?Give it at least 4 days, may have more bowel movements during that time.  ?If you continue to have severe diarrhea try every other day, if you get AB pain with it stop.  ?After you are out we can send in a prescription if you did well, there is a prescription card ? ? ?- Drink at least 64-80 ounces of water/liquid per day. ?- Establish a time to try to move your bowels every day.  For many people, this is after a cup of coffee or after a meal such as breakfast. ?- Sit all of the way back on the toilet keeping your back fairly straight and while sitting up, try to rest the tops of your forearms on your upper thighs.   ?- Raising your feet with a step stool/squatty potty can be helpful to improve the angle that allows your stool to pass through the rectum. ?- Relax the rectum feeling it bulge toward the toilet water.  If you feel your rectum raising toward your body, you are contracting rather than relaxing. ?- Breathe in and slowly exhale. "Belly breath" by expanding your belly towards your belly button. Keep belly expanded as you gently direct pressure down and back to the anus.  A low pitched GRRR sound can assist with increasing intra-abdominal pressure.  ?- Repeat 3-4 times. If unsuccessful, contract the pelvic floor to restore  normal tone and get off the toilet.  Avoid excessive straining. ?- To reduce excessive wiping by teaching your anus to normally contract, place hands on outer aspect of knees and resist knee movement outward.  Hold 5-10 second then place hands just inside of knees and resist inward movement of knees.  Hold 5 seconds.  Repeat a few times each way. ? ?Go to the ER if unable to pass gas, severe AB pain, unable to hold down food, any shortness of breath of chest pain.  ? ?Constipation, Adult ?Constipation is when a person has fewer than three bowel movements in a week, has difficulty having a bowel movement, or has stools (feces) that are dry, hard, or larger than normal. Constipation may be caused by an underlying condition. It may become worse with age if a person takes certain medicines and does not take in enough fluids. ?Follow these instructions at home: ?Eating and drinking ? ?Eat foods that have a lot of fiber, such as beans, whole grains, and fresh fruits and vegetables. ?Limit foods that are low in fiber and high in fat and processed sugars, such as fried or sweet foods. These include french fries, hamburgers, cookies, candies, and soda. ?Drink enough fluid to keep your urine pale yellow. ?General instructions ?Exercise regularly or as told by your health care provider. Try  to do 150 minutes of moderate exercise each week. ?Use the bathroom when you have the urge to go. Do not hold it in. ?Take over-the-counter and prescription medicines only as told by your health care provider. This includes any fiber supplements. ?During bowel movements: ?Practice deep breathing while relaxing the lower abdomen. ?Practice pelvic floor relaxation. ?Watch your condition for any changes. Let your health care provider know about them. ?Keep all follow-up visits as told by your health care provider. This is important. ?Contact a health care provider if: ?You have pain that gets worse. ?You have a fever. ?You do not have a bowel  movement after 4 days. ?You vomit. ?You are not hungry or you lose weight. ?You are bleeding from the opening between the buttocks (anus). ?You have thin, pencil-like stools. ?Get help right away if: ?You have a fever and your symptoms suddenly get worse. ?You leak stool or have blood in your stool. ?Your abdomen is bloated. ?You have severe pain in your abdomen. ?You feel dizzy or you faint. ?Summary ?Constipation is when a person has fewer than three bowel movements in a week, has difficulty having a bowel movement, or has stools (feces) that are dry, hard, or larger than normal. ?Eat foods that have a lot of fiber, such as beans, whole grains, and fresh fruits and vegetables. ?Drink enough fluid to keep your urine pale yellow. ?Take over-the-counter and prescription medicines only as told by your health care provider. This includes any fiber supplements. ?This information is not intended to replace advice given to you by your health care provider. Make sure you discuss any questions you have with your health care provider. ?Document Revised: 11/16/2018 Document Reviewed: 11/16/2018 ?Elsevier Patient Education ? 2022 Elsevier Inc[employer] ? ? ?

## 2021-04-23 ENCOUNTER — Telehealth: Payer: Self-pay | Admitting: Physician Assistant

## 2021-04-23 ENCOUNTER — Other Ambulatory Visit: Payer: Self-pay

## 2021-04-23 DIAGNOSIS — R768 Other specified abnormal immunological findings in serum: Secondary | ICD-10-CM

## 2021-04-23 LAB — H. PYLORI ANTIBODY, IGG: H Pylori IgG: POSITIVE — AB

## 2021-04-23 NOTE — Telephone Encounter (Signed)
Called to confirm patient's insurance with patient's pharmacy. I was advised that the medication was $400+, but after insurance was over $300. Good Rx was $199. She stated that Amitiza is covered, but it looks like she may have a high deductible plan. The pharmacist advised she tried to explain this multiple times to the patient. I reviewed that patient's formulary that shows Amitiza (generic) is a tier 2 and Lactulose was a tier 1. ?

## 2021-04-23 NOTE — Telephone Encounter (Signed)
Please advise on alternative.  

## 2021-04-23 NOTE — Telephone Encounter (Signed)
Inbound call from patient need an alternative medication for Amitiza. Reports over $200  ?

## 2021-04-24 ENCOUNTER — Other Ambulatory Visit: Payer: BC Managed Care – PPO

## 2021-04-24 MED ORDER — LACTULOSE 10 GM/15ML PO SOLN: 20.0000 g | Freq: Two times a day (BID) | ORAL | 0 refills | Status: AC

## 2021-04-24 NOTE — Telephone Encounter (Signed)
Spoke with patient & she is aware of medication that has been sent in. Patient advised to let us know how she is doing once she has started taking it. Follow up is scheduled for 05/27/21 at 2:00 pm. She is suppose to drop off stool sample on Monday. She did want me to note that if she were to need antibiotic therapy in the near future that she always gets a UTI and would need additional medication.  ?

## 2021-04-24 NOTE — Telephone Encounter (Signed)
Amitiza not covered, sent in lactulose and sent patient a message.  ?Will need follow up 4-6 weeks.  ? ?

## 2021-04-25 ENCOUNTER — Other Ambulatory Visit: Payer: BC Managed Care – PPO

## 2021-04-25 DIAGNOSIS — B9681 Helicobacter pylori [H. pylori] as the cause of diseases classified elsewhere: Secondary | ICD-10-CM | POA: Diagnosis not present

## 2021-04-25 DIAGNOSIS — R768 Other specified abnormal immunological findings in serum: Secondary | ICD-10-CM

## 2021-04-28 ENCOUNTER — Other Ambulatory Visit: Payer: Self-pay

## 2021-04-28 ENCOUNTER — Telehealth: Payer: Self-pay | Admitting: Physician Assistant

## 2021-04-28 DIAGNOSIS — A048 Other specified bacterial intestinal infections: Secondary | ICD-10-CM

## 2021-04-28 LAB — H. PYLORI ANTIGEN, STOOL: H pylori Ag, Stl: POSITIVE — AB

## 2021-04-28 MED ORDER — BISMUTH SUBSALICYLATE 262 MG PO TABS: 2.0000 | ORAL_TABLET | Freq: Four times a day (QID) | ORAL | 0 refills | Status: AC

## 2021-04-28 MED ORDER — METRONIDAZOLE 250 MG PO TABS: 500.0000 mg | ORAL_TABLET | Freq: Four times a day (QID) | ORAL | 0 refills | Status: AC

## 2021-04-28 MED ORDER — PANTOPRAZOLE SODIUM 40 MG PO TBEC
40.0000 mg | DELAYED_RELEASE_TABLET | Freq: Two times a day (BID) | ORAL | 0 refills | Status: DC
Start: 2021-04-28 — End: 2021-11-28

## 2021-04-28 MED ORDER — TETRACYCLINE HCL 500 MG PO CAPS
500.0000 mg | ORAL_CAPSULE | Freq: Four times a day (QID) | ORAL | 0 refills | Status: AC
Start: 2021-04-28 — End: 2021-05-12

## 2021-04-28 NOTE — Telephone Encounter (Signed)
Patient called to discuss results. Please advise.  ?

## 2021-04-28 NOTE — Telephone Encounter (Signed)
See the results note dated 4/17 H pylori stool  ?

## 2021-05-26 NOTE — Progress Notes (Deleted)
    05/26/2021 Alexandra Castillo [id number] 1985-08-14  Referring provider: No ref. provider found Primary GI doctor: {acdocs:27040}  ASSESSMENT AND PLAN:   There are no diagnoses linked to this encounter.   History of Present Illness:  36 y.o. female  with a past medical history of kidney stones, constipation, IBS  and others listed below, returns to clinic today for evaluation of constipation. 04/22/2021 patient seen and the office for constipation and reflux. Given Amitiza but this was very expensive due to high deductible plan, sent in lactulose. CBC within normal limits, kidney and liver normal, CRP normal thyroid normal. Patient had positive H. pylori antibody, H. pylori stool antigen was positive. Started on quadruple therapy.  Current Medications:        Current Outpatient Medications (Other):    pantoprazole (PROTONIX) 40 MG tablet, Take 1 tablet (40 mg total) by mouth 2 (two) times daily for 14 days.  Surgical History:  She  has a past surgical history that includes None. Family History:  Her family history includes Heart failure in her mother; Kidney failure in her mother. Social History:   reports that she has never smoked. She has never used smokeless tobacco. She reports current alcohol use. She reports that she does not use drugs.  Current Medications, Allergies, Past Medical History, Past Surgical History, Family History and Social History were reviewed in [employer] record.  Physical Exam: There were no vitals taken for this visit. General:   Pleasant, well developed female in no acute distress Heart : Regular rate and rhythm; no murmurs Pulm: Clear anteriorly; no wheezing Abdomen:  {BlankSingle:19197::"Distended","Ridged","Soft"}, {BlankSingle:19197::"Flat","Obese","Non-distended"} AB, {BlankSingle:19197::"Absent","Hyperactive, tinkling","Hypoactive","Sluggish","Active"} bowel sounds. {actendernessAB:27319} tenderness {anatomy; site  abdomen:5010}. {BlankMultiple:19196::"Without guarding","With guarding","Without rebound","With rebound"}, No organomegaly appreciated. Rectal: {acrectalexam:27461} Extremities:  {With/without:5700}  edema. Neurologic:  Alert and  oriented x4;  No focal deficits.  Psych:  Cooperative. Normal mood and affect.   Doree Albee, PA-C 05/26/21

## 2021-05-27 ENCOUNTER — Ambulatory Visit: Payer: BC Managed Care – PPO | Admitting: Physician Assistant

## 2021-06-17 NOTE — Progress Notes (Unsigned)
    06/18/2021 Alexandra Castillo [id number] 23-Mar-1985  Referring provider: No ref. provider found Primary GI doctor: Dr. Leonides Schanz  ASSESSMENT AND PLAN:   H. pylori infection -     Helicobacter pylori special antigen; Future Has completed treatment, encouraged to get household tested.  Get off PPI x 2 weeks and return stool to ensure erradication  Chronic idiopathic constipation And bloating improved after H pylori treatment- ? Methane producing SIBO Get on miralax once daily and benefiber Can take lactulose as needed.  If symptoms persist could do colonoscopy to rule out intraluminal pathology  Gastroesophageal reflux disease without esophagitis she reports symptoms are currently well controlled, and denies breakthrough reflux or night time symptoms. Lifestyle changes discussed, avoid NSAIDS Do trial off medications and see how she does   History of Present Illness:  36 y.o. female  with a past medical history of kidney stones, constipation, IBS  and others listed below, returns to clinic today for evaluation of constipation and GERD.   04/22/2021 patient seen and the office for constipation and reflux. Given Amitiza but this was very expensive due to high deductible plan, sent in lactulose.  CBC within normal limits, kidney and liver normal, CRP normal thyroid normal. Patient had positive H. pylori antibody, H. pylori stool antigen was positive. Started on quadruple therapy.  She takes lactulose as needed.  She states she is feeling better after the antibiotics, she is not having as much GERd.  She has less bloating and she is having better bowel movements.  She has BM every 1-2 days, she takes lactulose as needed Has some gas with this.  Feels worse with red meat and has cut this out.  She is still on the pantoprazole.   Current Medications:        Current Outpatient Medications (Other):    pantoprazole (PROTONIX) 40 MG tablet, Take 1 tablet (40 mg total) by mouth 2  (two) times daily for 14 days.  Surgical History:  She  has a past surgical history that includes None. Family History:  Her family history includes Heart failure in her mother; Kidney failure in her mother. Social History:   reports that she has never smoked. She has never used smokeless tobacco. She reports current alcohol use. She reports that she does not use drugs.  Current Medications, Allergies, Past Medical History, Past Surgical History, Family History and Social History were reviewed in [employer] record.  Physical Exam: BP 124/70   Pulse 90   Ht 5\' 6"  (1.676 m)   Wt 235 lb (106.6 kg)   SpO2 97%   BMI 37.93 kg/m  General:   Pleasant, well developed female in no acute distress Heart : Regular rate and rhythm; no murmurs Pulm: Clear anteriorly; no wheezing Abdomen:  Soft, Obese AB, Active bowel sounds. No tenderness . , No organomegaly appreciated. Rectal: Not evaluated Extremities:  without  edema. Neurologic:  Alert and  oriented x4;  No focal deficits.  Psych:  Cooperative. Normal mood and affect.   , PA-C 06/18/21

## 2021-06-18 ENCOUNTER — Other Ambulatory Visit: Payer: BC Managed Care – PPO

## 2021-06-18 ENCOUNTER — Encounter: Payer: Self-pay | Admitting: Physician Assistant

## 2021-06-18 ENCOUNTER — Ambulatory Visit (INDEPENDENT_AMBULATORY_CARE_PROVIDER_SITE_OTHER): Payer: BC Managed Care – PPO | Admitting: Physician Assistant

## 2021-06-18 VITALS — BP 124/70 | HR 90 | Ht 66.0 in | Wt 235.0 lb

## 2021-06-18 DIAGNOSIS — K219 Gastro-esophageal reflux disease without esophagitis: Secondary | ICD-10-CM | POA: Diagnosis not present

## 2021-06-18 DIAGNOSIS — A048 Other specified bacterial intestinal infections: Secondary | ICD-10-CM

## 2021-06-18 DIAGNOSIS — K5904 Chronic idiopathic constipation: Secondary | ICD-10-CM | POA: Diagnosis not present

## 2021-06-18 NOTE — Patient Instructions (Signed)
Stop pantoprazole and in 2 weeks get the H pylori stool and bring that in.  Miralax is an osmotic laxative.  It only brings more water into the stool.  This is safe to take daily.  Can take up to 17 gram of miralax twice a day.  Mix with juice or coffee.  Start 1 capful at night for 3-4 days and reassess your response in 3-4 days.  You can increase and decrease the dose based on your response.  Remember, it can take up to 3-4 days to take effect OR for the effects to wear off.   I often pair this with benefiber in the morning to help assure the stool is not too loose.   Recommend starting on a fiber supplement, like citracel or benefiber Take with fiber with with a full 8 oz glass of water once a day. This can take 1 month to start helping, so try for at least one month.  Recommend increasing water and activity.  Get a squatty potty to use at home or try a stool, goal is to get your knees above your hips during a bowel movement.  - Drink at least 64-80 ounces of water/liquid per day. - Establish a time to try to move your bowels every day.  For many people, this is after a cup of coffee or after a meal such as breakfast. - Sit all of the way back on the toilet keeping your back fairly straight and while sitting up, try to rest the tops of your forearms on your upper thighs.   - Raising your feet with a step stool/squatty potty can be helpful to improve the angle that allows your stool to pass through the rectum. - Relax the rectum feeling it bulge toward the toilet water.  If you feel your rectum raising toward your body, you are contracting rather than relaxing. - Breathe in and slowly exhale. "Belly breath" by expanding your belly towards your belly button. Keep belly expanded as you gently direct pressure down and back to the anus.  A low pitched GRRR sound can assist with increasing intra-abdominal pressure.  - Repeat 3-4 times. If unsuccessful, contract the pelvic floor to restore normal  tone and get off the toilet.  Avoid excessive straining. - To reduce excessive wiping by teaching your anus to normally contract, place hands on outer aspect of knees and resist knee movement outward.  Hold 5-10 second then place hands just inside of knees and resist inward movement of knees.  Hold 5 seconds.  Repeat a few times each way.  Go to the ER if unable to pass gas, severe AB pain, unable to hold down food, any shortness of breath of chest pain.   Constipation, Adult Constipation is when a person has fewer than three bowel movements in a week, has difficulty having a bowel movement, or has stools (feces) that are dry, hard, or larger than normal. Constipation may be caused by an underlying condition. It may become worse with age if a person takes certain medicines and does not take in enough fluids. Follow these instructions at home: Eating and drinking  Eat foods that have a lot of fiber, such as beans, whole grains, and fresh fruits and vegetables. Limit foods that are low in fiber and high in fat and processed sugars, such as fried or sweet foods. These include french fries, hamburgers, cookies, candies, and soda. Drink enough fluid to keep your urine pale yellow. General instructions Exercise regularly or as  told by your health care provider. Try to do 150 minutes of moderate exercise each week. Use the bathroom when you have the urge to go. Do not hold it in. Take over-the-counter and prescription medicines only as told by your health care provider. This includes any fiber supplements. During bowel movements: Practice deep breathing while relaxing the lower abdomen. Practice pelvic floor relaxation. Watch your condition for any changes. Let your health care provider know about them. Keep all follow-up visits as told by your health care provider. This is important. Contact a health care provider if: You have pain that gets worse. You have a fever. You do not have a bowel  movement after 4 days. You vomit. You are not hungry or you lose weight. You are bleeding from the opening between the buttocks (anus). You have thin, pencil-like stools. Get help right away if: You have a fever and your symptoms suddenly get worse. You leak stool or have blood in your stool. Your abdomen is bloated. You have severe pain in your abdomen. You feel dizzy or you faint. Summary Constipation is when a person has fewer than three bowel movements in a week, has difficulty having a bowel movement, or has stools (feces) that are dry, hard, or larger than normal. Eat foods that have a lot of fiber, such as beans, whole grains, and fresh fruits and vegetables. Drink enough fluid to keep your urine pale yellow. Take over-the-counter and prescription medicines only as told by your health care provider. This includes any fiber supplements. This information is not intended to replace advice given to you by your health care provider. Make sure you discuss any questions you have with your health care provider. Document Revised: 11/16/2018 Document Reviewed: 11/16/2018 Elsevier Patient Education  2022 Elsevier Inc.   Abdominal bloating and discomfort may be due to intestinal sensitivity or symptoms of irritable bowel syndrome. To relieve symptoms, avoid:  Broccoli  Baked beans  Cabbage  Carbonated drinks  Cauliflower  Chewing gum  Hard candy Abdominal distention resulting from weak abdominal muscles:  Is better in the morning  Gets worse as the day progresses  Is relieved by lying down Flatulence is gas created through bacterial action in the bowel and passed rectally. Keep in mind that:  10-18 passages per day are normal  Primary gases are harmless and odorless  Noticeable smells are trace gases related to food intake Foods to AVOID that are likely to form gas include:  Milk, dairy products, and medications that contain lactose--If your body doesn't produce the enzyme (lactase)  to break it down.  Certain vegetables--baked beans, cauliflower, broccoli, cabbage  Certain starches--wheat, oats, corn, potatoes. Rice is a good substitute. Identify offending foods. Reduce or eliminate these gas-forming foods from your diet. Can look at the FODMAP diet.

## 2021-06-18 NOTE — Progress Notes (Signed)
I agree with the assessment and plan as outlined by Ms. Collier. 

## 2021-09-04 DIAGNOSIS — Z91040 Latex allergy status: Secondary | ICD-10-CM | POA: Diagnosis not present

## 2021-09-04 DIAGNOSIS — R1030 Lower abdominal pain, unspecified: Secondary | ICD-10-CM | POA: Diagnosis not present

## 2021-09-04 DIAGNOSIS — R102 Pelvic and perineal pain: Secondary | ICD-10-CM | POA: Diagnosis not present

## 2021-09-04 DIAGNOSIS — Z885 Allergy status to narcotic agent status: Secondary | ICD-10-CM | POA: Diagnosis not present

## 2021-09-04 DIAGNOSIS — R109 Unspecified abdominal pain: Secondary | ICD-10-CM | POA: Diagnosis not present

## 2021-09-04 DIAGNOSIS — R1031 Right lower quadrant pain: Secondary | ICD-10-CM | POA: Diagnosis not present

## 2021-11-28 ENCOUNTER — Other Ambulatory Visit: Payer: Self-pay | Admitting: Physician Assistant

## 2021-11-28 NOTE — Telephone Encounter (Signed)
Please advise 

## 2021-12-24 ENCOUNTER — Telehealth: Payer: Self-pay

## 2021-12-24 ENCOUNTER — Other Ambulatory Visit: Payer: Self-pay

## 2021-12-24 DIAGNOSIS — A048 Other specified bacterial intestinal infections: Secondary | ICD-10-CM

## 2021-12-24 NOTE — Telephone Encounter (Signed)
Called to remind patient to submit stool sample. Advised her on when/where to go to pick up kit. She's aware to stop pantoprazole two weeks prior to submitting sample.

## 2022-02-27 IMAGING — DX DG CHEST 1V PORT
1 series · 1 of 1 positions shown · non-contrast
Comparison: None.

CLINICAL DATA: Shortness of breath

EXAM:
PORTABLE CHEST 1 VIEW

[chest ap]
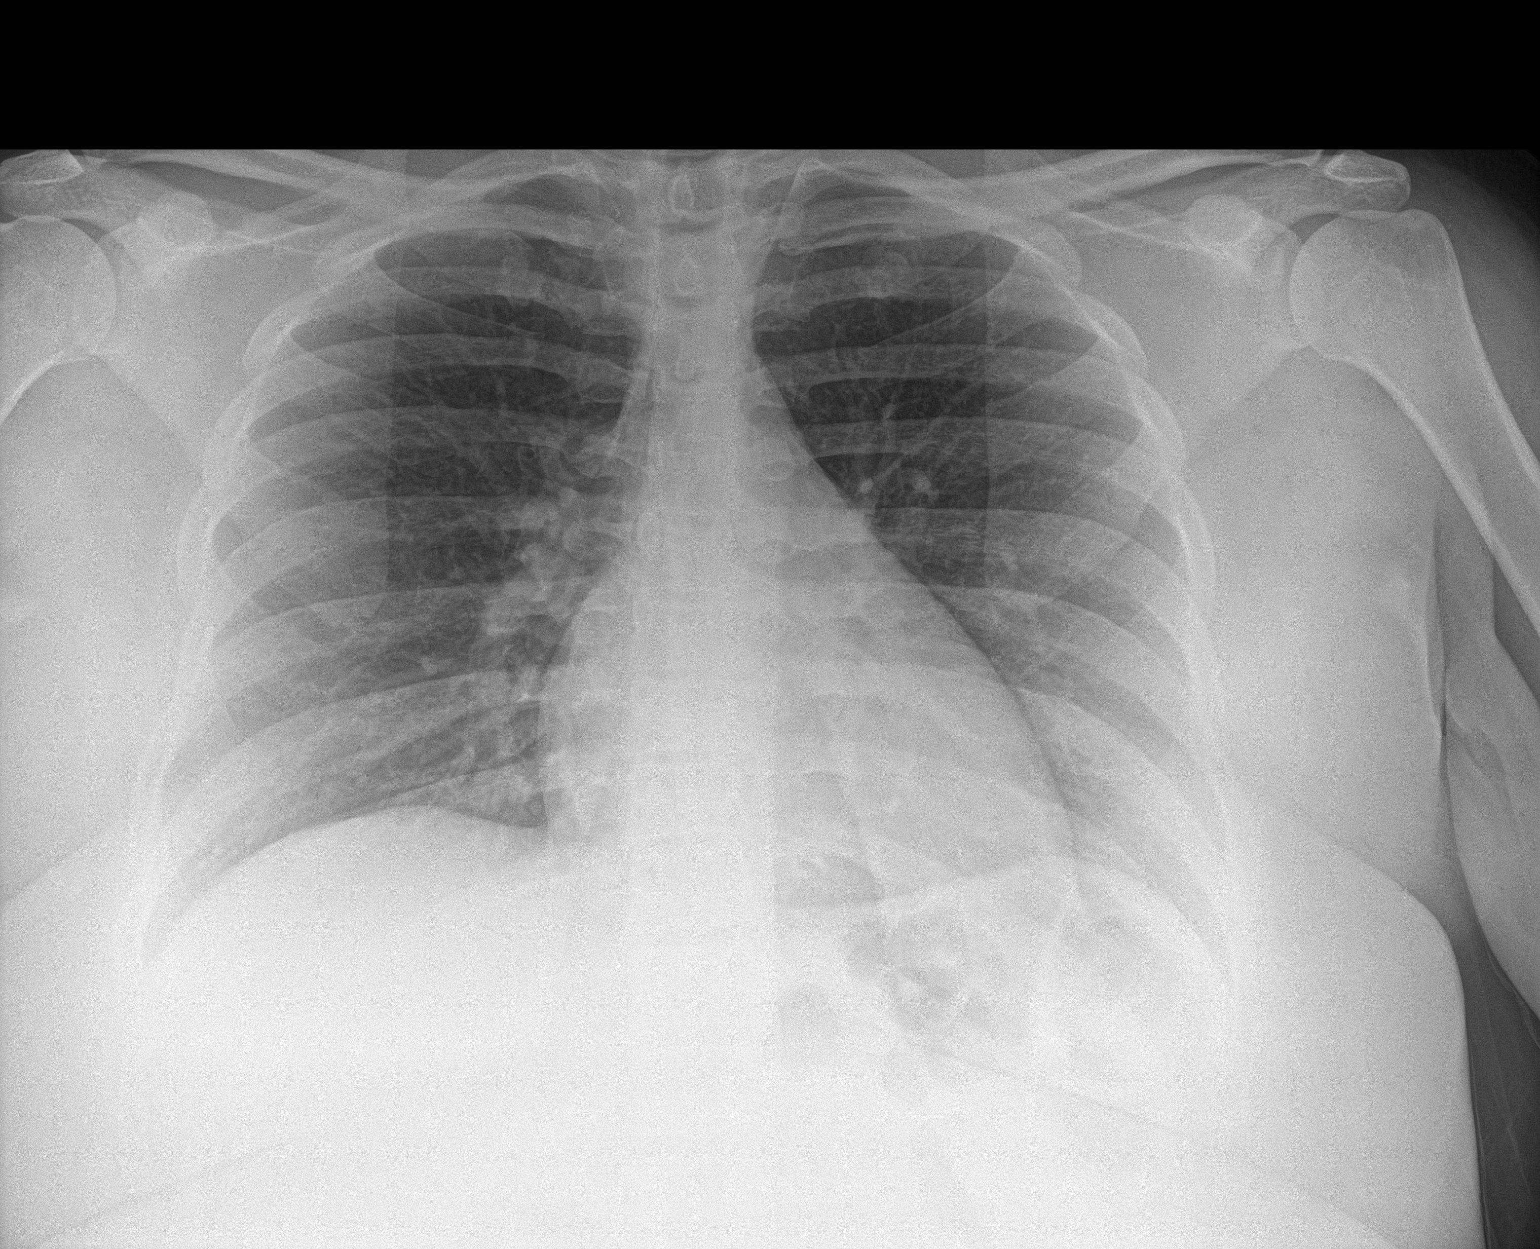

[1 of 1 positions shown; findings below may reference images not displayed]

FINDINGS: The heart size and mediastinal contours are within normal limits.
Both lungs are clear. The visualized skeletal structures are
unremarkable.
IMPRESSION: Normal study.

## 2022-05-15 ENCOUNTER — Telehealth: Payer: Self-pay | Admitting: Physician Assistant

## 2022-05-15 ENCOUNTER — Other Ambulatory Visit: Payer: Self-pay

## 2022-05-15 MED ORDER — LINACLOTIDE 290 MCG PO CAPS: 290.0000 ug | ORAL_CAPSULE | Freq: Every day | ORAL | 0 refills | Status: AC

## 2022-05-15 NOTE — Telephone Encounter (Signed)
Patient called in to discuss Linzess. She did really well on it previously, however insurance denied it last year. She was given lactulose instead and tried it for 2-3 months, and did not feel like it helped. Now that she has tried & failed with the lactulose, she would like to see if she can be prescribed Linzess. She is only having one bowel movement per week. She has also been reminded to turn in stool sample ordered at last visit prior to her OV in July with Marchelle Folks, and to remain off of PPI's. Pt verbalized all understanding.

## 2022-05-15 NOTE — Telephone Encounter (Signed)
Spoke with patient & advised her that prescription for linzess has been sent to her pharmacy. She will call back if there is any issues with insurance. She did confirm she was previously taking the 290 mcg of Linzess. Patient had no further questions at end of call.

## 2022-05-15 NOTE — Telephone Encounter (Signed)
Inbound call from patient requesting to speak with a nurse .Patient stated that she was supposed to summit a stool sample back on December and she completely forgot about , now she is schedule to follow up with Quentin Mulling..Pt is wondering is there something she could possibly be done before then.Please advise .Thanks

## 2022-05-18 NOTE — Telephone Encounter (Signed)
Inbound call from patient, states pharmacy is requesting prior authorization for Linzess.

## 2022-05-21 NOTE — Telephone Encounter (Signed)
Inbound call from patient states she has not received any updates on the medication, or been able to pick it up. Is requesting to speak to a nurse or someone from prior authorization to see where the process is.

## 2022-05-22 ENCOUNTER — Other Ambulatory Visit (HOSPITAL_COMMUNITY): Payer: Self-pay

## 2022-05-22 NOTE — Telephone Encounter (Signed)
[facility] in [place] has electronically sent pharmacy benefits. Requesting call back

## 2022-05-22 NOTE — Telephone Encounter (Signed)
Please have patient submit new insurance cards, no pharmacy benefits found on file. Thank you!

## 2022-05-22 NOTE — Telephone Encounter (Signed)
Called pt and requested she come and update her insurance cards. She is going to try and get the pharmacy coverage card sent via mychart.

## 2022-05-25 NOTE — Telephone Encounter (Signed)
Have not received information from pharmacy. Called [facility] in [place] and got the following coverage info:   BIN#[id number] PCN IRX Card# [id number] Group# [id number] 

## 2022-05-25 NOTE — Telephone Encounter (Signed)
Patient called stating that her pharmacy has sent over coverage card. Stated the pharmacy still has not received authorization to be able to fill medication. Patient is requesting a update, please advise.

## 2022-05-25 NOTE — Telephone Encounter (Signed)
Please see notes below.

## 2022-05-25 NOTE — Telephone Encounter (Signed)
I called the pharmacy and got her Bin, group, and id number see below in message. Can you not use that?

## 2022-05-25 NOTE — Telephone Encounter (Signed)
No documents of coverage on patients chart at this time in order to complete benefits investigation/PA

## 2022-05-26 ENCOUNTER — Telehealth: Payer: Self-pay

## 2022-05-26 ENCOUNTER — Other Ambulatory Visit (HOSPITAL_COMMUNITY): Payer: Self-pay

## 2022-05-26 NOTE — Telephone Encounter (Signed)
PA has been APPROVED from 05/26/2022-05/26/2023

## 2022-05-26 NOTE — Telephone Encounter (Signed)
Thank you, PA has been submitted and is pending determination. Will be updated in additional encounter created.

## 2022-05-26 NOTE — Telephone Encounter (Signed)
Have not received information from pharmacy. Called [facility] in [place] and got the following coverage info:   WUJ#[id number] PCN IRX Card# [id number] Group# [id number]

## 2022-05-26 NOTE — Telephone Encounter (Signed)
I am not able to see the message

## 2022-05-26 NOTE — Telephone Encounter (Signed)
PA request received via provider for Linzess capsules  PA submitted to [employer] via CMM and is pending determination  Key: [id number]

## 2022-07-21 NOTE — Progress Notes (Deleted)
    07/21/2022 Alexandra Castillo [id number] Jun 20, 1985  Referring provider: No ref. provider found Primary GI doctor: Dr. Leonides Schanz  ASSESSMENT AND PLAN:   H. pylori infection Has completed treatment, encouraged to get household tested.  Get off PPI x 2 weeks and return stool to ensure erradication  Chronic idiopathic constipation And bloating improved after H pylori treatment- ? Methane producing SIBO Get on miralax once daily and benefiber Can take lactulose as needed.  If symptoms persist could do colonoscopy to rule out intraluminal pathology  Gastroesophageal reflux disease without esophagitis she reports symptoms are currently well controlled, and denies breakthrough reflux or night time symptoms. Lifestyle changes discussed, avoid NSAIDS Do trial off medications and see how she does   History of Present Illness:  37 y.o. female  with a past medical history of kidney stones, constipation, IBS  and others listed below, returns to clinic today for evaluation of constipation and GERD.   04/22/2021 patient seen in office for constipation and reflux. Given Amitiza but this was very expensive due to high deductible plan, sent in lactulose. Normal CRP Patient had positive H. pylori antibody, H. pylori stool antigen was positive. Started on quadruple therapy. 06/18/2021 patient seen, supposed to come back for H. pylori stool for eradication has not done this.    Current Medications:        Current Outpatient Medications (Other):    linaclotide (LINZESS) 290 MCG CAPS capsule, Take 1 capsule (290 mcg total) by mouth daily before breakfast.   pantoprazole (PROTONIX) 40 MG tablet, Take 1 tablet by mouth twice daily for 14 days  Surgical History:  She  has a past surgical history that includes None. Family History:  Her family history includes Heart failure in her mother; Kidney failure in her mother. Social History:   reports that she has never smoked. She has never used  smokeless tobacco. She reports current alcohol use. She reports that she does not use drugs.  Current Medications, Allergies, Past Medical History, Past Surgical History, Family History and Social History were reviewed in [employer] record.  Physical Exam: There were no vitals taken for this visit. General:   Pleasant, well developed female in no acute distress Heart : Regular rate and rhythm; no murmurs Pulm: Clear anteriorly; no wheezing Abdomen:  Soft, Obese AB, Active bowel sounds. No tenderness . , No organomegaly appreciated. Rectal: Not evaluated Extremities:  without  edema. Neurologic:  Alert and  oriented x4;  No focal deficits.  Psych:  Cooperative. Normal mood and affect.   Doree Albee, PA-C 07/21/22

## 2022-07-22 ENCOUNTER — Encounter (HOSPITAL_COMMUNITY): Payer: Self-pay

## 2022-07-22 ENCOUNTER — Emergency Department (HOSPITAL_COMMUNITY): Payer: BC Managed Care – PPO

## 2022-07-22 ENCOUNTER — Ambulatory Visit: Payer: BC Managed Care – PPO | Admitting: Physician Assistant

## 2022-07-22 ENCOUNTER — Emergency Department (HOSPITAL_COMMUNITY)
Admission: EM | Admit: 2022-07-22 | Discharge: 2022-07-22 | Disposition: A | Discharge status: Home / Self Care | Payer: BC Managed Care – PPO | Attending: Emergency Medicine | Admitting: Emergency Medicine

## 2022-07-22 ENCOUNTER — Other Ambulatory Visit: Payer: Self-pay

## 2022-07-22 DIAGNOSIS — Z91040 Latex allergy status: Secondary | ICD-10-CM | POA: Insufficient documentation

## 2022-07-22 DIAGNOSIS — R079 Chest pain, unspecified: Secondary | ICD-10-CM | POA: Diagnosis not present

## 2022-07-22 DIAGNOSIS — I160 Hypertensive urgency: Secondary | ICD-10-CM | POA: Diagnosis not present

## 2022-07-22 DIAGNOSIS — R42 Dizziness and giddiness: Secondary | ICD-10-CM | POA: Insufficient documentation

## 2022-07-22 LAB — BASIC METABOLIC PANEL
Anion gap: 11 (ref 5–15)
BUN: 11 mg/dL (ref 6–20)
CO2: 25 mmol/L (ref 22–32)
Calcium: 8.8 mg/dL — ABNORMAL LOW (ref 8.9–10.3)
Chloride: 102 mmol/L (ref 98–111)
Creatinine, Ser: 0.77 mg/dL (ref 0.44–1.00)
GFR, Estimated: >60 mL/min (ref >60)
Glucose, Bld: 95 mg/dL (ref 70–99)
Potassium: 4.2 mmol/L (ref 3.5–5.1)
Sodium: 138 mmol/L (ref 135–145)

## 2022-07-22 LAB — CBC
HCT: 40 % (ref 36.0–46.0)
Hemoglobin: 13.1 g/dL (ref 12.0–15.0)
MCH: 31 pg (ref 26.0–34.0)
MCHC: 32.8 g/dL (ref 30.0–36.0)
MCV: 94.6 fL (ref 80.0–100.0)
Platelets: 228 10*3/uL (ref 150–400)
RBC: 4.23 MIL/uL (ref 3.87–5.11)
RDW: 13.4 % (ref 11.5–15.5)
WBC: 7.5 10*3/uL (ref 4.0–10.5)
nRBC: 0 % (ref 0.0–0.2)

## 2022-07-22 LAB — TROPONIN I (HIGH SENSITIVITY)
Troponin I (High Sensitivity): <2 ng/L (ref <18)
Troponin I (High Sensitivity): <2 ng/L (ref <18)

## 2022-07-22 LAB — HCG, SERUM, QUALITATIVE: Preg, Serum: NEGATIVE

## 2022-07-22 MED ORDER — CHLORTHALIDONE 25 MG PO TABS
25.0000 mg | ORAL_TABLET | Freq: Once | ORAL | Status: AC
  Administered 2022-07-22: 25 mg via ORAL
  Filled 2022-07-22: qty 1

## 2022-07-22 MED ORDER — CHLORTHALIDONE 25 MG PO TABS: 25.0000 mg | ORAL_TABLET | Freq: Every day | ORAL | 0 refills | Status: AC

## 2022-07-22 NOTE — ED Notes (Signed)
Patient transported to X-ray 

## 2022-07-22 NOTE — ED Notes (Signed)
Walked patient to the bathroom patient did well 

## 2022-07-22 NOTE — ED Triage Notes (Signed)
Pt c/o non radiating midsternal chest pain, SOB and dizzinessx3wks. Pt states she checked her bp at work about 30 mins ago and it was 179/121. Pt states she doesn't take bp meds. Pt able to speak in complete sentences.

## 2022-07-22 NOTE — ED Provider Notes (Signed)
[facility] Provider Note   CSN: [id number] Arrival date & time: 07/22/22  7829     History  Chief Complaint  Patient presents with   Chest Pain   Dizziness   Shortness of Breath    Alexandra Castillo is a 37 y.o. female.  HPI Patient presents with concern of 3 weeks of feeling abnormal.  Today she had chest pain, but for the past week she has had some lightheadedness some dizziness, some dyspnea.  No true syncope, no fall, no persistent chest pain.  Today with chest pain she took her blood pressure, found it to be elevated and presents for evaluation.  She is accompanied by her husband.     Home Medications Prior to Admission medications   Medication Sig Start Date End Date Taking? Authorizing Provider  chlorthalidone (HYGROTON) 25 MG tablet Take 1 tablet (25 mg total) by mouth daily. 07/22/22  Yes Gerhard Munch, MD  linaclotide [facility]) 290 MCG CAPS capsule Take 1 capsule (290 mcg total) by mouth daily before breakfast. 05/15/22 08/13/22  Doree Albee, PA-C  pantoprazole (PROTONIX) 40 MG tablet Take 1 tablet by mouth twice daily for 14 days 11/28/21   Doree Albee, PA-C      Allergies    Hydrocodeine [dihydrocodeine] and Latex    Review of Systems   Review of Systems  All other systems reviewed and are negative.   Physical Exam Updated Vital Signs BP 117/73   Pulse 99   Temp 98.5 F (36.9 C) (Oral)   Resp 20   Ht 5\' 6"  (1.676 m)   Wt 106.6 kg   SpO2 100%   BMI 37.93 kg/m  Physical Exam Vitals and nursing note reviewed.  Constitutional:      General: She is not in acute distress.    Appearance: She is obese.  HENT:     Head: Normocephalic and atraumatic.  Eyes:     Conjunctiva/sclera: Conjunctivae normal.  Cardiovascular:     Rate and Rhythm: Normal rate and regular rhythm.  Pulmonary:     Effort: Pulmonary effort is normal. No respiratory distress.     Breath sounds: Normal breath sounds. No  stridor.  Abdominal:     General: There is no distension.  Skin:    General: Skin is warm and dry.  Neurological:     General: No focal deficit present.     Mental Status: She is alert and oriented to person, place, and time.     Cranial Nerves: No cranial nerve deficit.  Psychiatric:        Mood and Affect: Mood normal.     ED Results / Procedures / Treatments   Labs (all labs ordered are listed, but only abnormal results are displayed) Labs Reviewed  BASIC METABOLIC PANEL - Abnormal; Notable for the following components:      Result Value   Calcium 8.8 (*)    All other components within normal limits  CBC  HCG, SERUM, QUALITATIVE  TROPONIN I (HIGH SENSITIVITY)  TROPONIN I (HIGH SENSITIVITY)    EKG EKG Interpretation Date/Time:  Wednesday July 22 2022 09:06:27 EDT Ventricular Rate:  75 PR Interval:  125 QRS Duration:  91 QT Interval:  411 QTC Calculation: 460 R Axis:   37  Text Interpretation: Sinus rhythm Low voltage, precordial leads Abnormal R-wave progression, early transition Confirmed by Gerhard Munch [phone]) on 07/22/2022 9:53:26 AM  Radiology DG Chest 2 View  Result Date: 07/22/2022 CLINICAL DATA:  cp EXAM: CHEST - 2 VIEW COMPARISON:  CXR 05/01/19 FINDINGS: No pleural effusion. No pneumothorax. Normal cardiac and mediastinal contours. No focal airspace opacity. No radiographically apparent displaced rib fractures. Visualized upper abdomen is unremarkable. Vertebral body heights are maintained. IMPRESSION: No focal airspace opacity. Electronically Signed   By: Lorenza Cambridge M.D.   On: 07/22/2022 09:38    Procedures Procedures    Medications Ordered in ED Medications  chlorthalidone (HYGROTON) tablet 25 mg (has no administration in time range)    ED Course/ Medical Decision Making/ A&P                             Medical Decision Making Adult female, obese otherwise well, presents with dizziness, occasional chest pain, occasional lightheadedness, but  on exam is awake, alert, neurologically unremarkable. Concern for hypertensive urgency versus ACS, less likely dissection given the benign neuroexam, absence of posterior pain.  Patient's initial blood pressure elevated, but this improved here.  No evidence for endorgan damage, patient amenable to starting blood pressure medication, close outpatient follow-up and return precautions.   Amount and/or Complexity of Data Reviewed Independent Historian: spouse Labs: ordered. Decision-making details documented in ED Course. Radiology: ordered and independent interpretation performed. Decision-making details documented in ED Course. ECG/medicine tests: ordered and independent interpretation performed. Decision-making details documented in ED Course.  Risk Prescription drug management. Decision regarding hospitalization. Diagnosis or treatment significantly limited by social determinants of health.   1:29 PM Patient in no distress, smiling, pleasantly interactive, aware of all findings.        Final Clinical Impression(s) / ED Diagnoses Final diagnoses:  Hypertensive urgency    Rx / DC Orders ED Discharge Orders          Ordered    chlorthalidone (HYGROTON) 25 MG tablet  Daily        07/22/22 1325              Gerhard Munch, MD 07/22/22 1329

## 2022-07-22 NOTE — Discharge Instructions (Signed)
It is important to follow-up with primary care physician, and as we discussed, return here for concerning changes in your condition.

## 2022-07-24 DIAGNOSIS — I10 Essential (primary) hypertension: Secondary | ICD-10-CM | POA: Diagnosis not present

## 2022-07-24 DIAGNOSIS — R42 Dizziness and giddiness: Secondary | ICD-10-CM | POA: Diagnosis not present

## 2022-07-24 DIAGNOSIS — Z885 Allergy status to narcotic agent status: Secondary | ICD-10-CM | POA: Diagnosis not present

## 2022-07-24 DIAGNOSIS — K589 Irritable bowel syndrome without diarrhea: Secondary | ICD-10-CM | POA: Diagnosis not present

## 2022-07-24 DIAGNOSIS — H538 Other visual disturbances: Secondary | ICD-10-CM | POA: Diagnosis not present

## 2022-07-24 DIAGNOSIS — F419 Anxiety disorder, unspecified: Secondary | ICD-10-CM | POA: Diagnosis not present

## 2022-07-27 DIAGNOSIS — Z0001 Encounter for general adult medical examination with abnormal findings: Secondary | ICD-10-CM | POA: Diagnosis not present

## 2022-07-27 DIAGNOSIS — R000 Tachycardia, unspecified: Secondary | ICD-10-CM | POA: Diagnosis not present

## 2022-07-27 DIAGNOSIS — H6502 Acute serous otitis media, left ear: Secondary | ICD-10-CM | POA: Diagnosis not present

## 2022-07-27 DIAGNOSIS — I10 Essential (primary) hypertension: Secondary | ICD-10-CM | POA: Diagnosis not present

## 2022-07-27 DIAGNOSIS — E559 Vitamin D deficiency, unspecified: Secondary | ICD-10-CM | POA: Diagnosis not present

## 2022-07-30 DIAGNOSIS — G8929 Other chronic pain: Secondary | ICD-10-CM | POA: Diagnosis not present

## 2022-07-30 DIAGNOSIS — R531 Weakness: Secondary | ICD-10-CM | POA: Diagnosis not present

## 2022-07-30 DIAGNOSIS — M79602 Pain in left arm: Secondary | ICD-10-CM | POA: Diagnosis not present

## 2022-07-30 DIAGNOSIS — M25512 Pain in left shoulder: Secondary | ICD-10-CM | POA: Diagnosis not present

## 2022-07-30 DIAGNOSIS — Z91040 Latex allergy status: Secondary | ICD-10-CM | POA: Diagnosis not present

## 2022-07-30 DIAGNOSIS — R29898 Other symptoms and signs involving the musculoskeletal system: Secondary | ICD-10-CM | POA: Diagnosis not present

## 2022-07-30 DIAGNOSIS — R200 Anesthesia of skin: Secondary | ICD-10-CM | POA: Diagnosis not present

## 2022-07-30 DIAGNOSIS — Z885 Allergy status to narcotic agent status: Secondary | ICD-10-CM | POA: Diagnosis not present

## 2022-07-30 DIAGNOSIS — R202 Paresthesia of skin: Secondary | ICD-10-CM | POA: Diagnosis not present

## 2022-08-03 ENCOUNTER — Ambulatory Visit: Payer: BC Managed Care – PPO | Admitting: Internal Medicine

## 2022-08-04 ENCOUNTER — Encounter: Payer: Self-pay | Admitting: General Practice

## 2022-08-13 DIAGNOSIS — E669 Obesity, unspecified: Secondary | ICD-10-CM | POA: Diagnosis not present

## 2022-08-13 DIAGNOSIS — H6692 Otitis media, unspecified, left ear: Secondary | ICD-10-CM | POA: Diagnosis not present

## 2022-08-13 DIAGNOSIS — Z6838 Body mass index (BMI) 38.0-38.9, adult: Secondary | ICD-10-CM | POA: Diagnosis not present

## 2022-08-13 DIAGNOSIS — H6092 Unspecified otitis externa, left ear: Secondary | ICD-10-CM | POA: Diagnosis not present

## 2022-08-18 DIAGNOSIS — E559 Vitamin D deficiency, unspecified: Secondary | ICD-10-CM | POA: Diagnosis not present

## 2022-08-18 DIAGNOSIS — F411 Generalized anxiety disorder: Secondary | ICD-10-CM | POA: Diagnosis not present

## 2022-08-18 DIAGNOSIS — Z0001 Encounter for general adult medical examination with abnormal findings: Secondary | ICD-10-CM | POA: Diagnosis not present

## 2022-08-18 DIAGNOSIS — R000 Tachycardia, unspecified: Secondary | ICD-10-CM | POA: Diagnosis not present

## 2022-08-26 DIAGNOSIS — F411 Generalized anxiety disorder: Secondary | ICD-10-CM | POA: Diagnosis not present

## 2022-08-26 DIAGNOSIS — F32A Depression, unspecified: Secondary | ICD-10-CM | POA: Diagnosis not present

## 2022-08-27 DIAGNOSIS — F410 Panic disorder [episodic paroxysmal anxiety] without agoraphobia: Secondary | ICD-10-CM | POA: Diagnosis not present

## 2022-09-02 DIAGNOSIS — F419 Anxiety disorder, unspecified: Secondary | ICD-10-CM | POA: Diagnosis not present

## 2022-09-02 DIAGNOSIS — I10 Essential (primary) hypertension: Secondary | ICD-10-CM | POA: Diagnosis not present

## 2022-09-02 DIAGNOSIS — F3342 Major depressive disorder, recurrent, in full remission: Secondary | ICD-10-CM | POA: Diagnosis not present

## 2022-09-02 DIAGNOSIS — Z1330 Encounter for screening examination for mental health and behavioral disorders, unspecified: Secondary | ICD-10-CM | POA: Diagnosis not present

## 2022-09-02 DIAGNOSIS — Z0000 Encounter for general adult medical examination without abnormal findings: Secondary | ICD-10-CM | POA: Diagnosis not present

## 2022-09-10 DIAGNOSIS — F411 Generalized anxiety disorder: Secondary | ICD-10-CM | POA: Diagnosis not present

## 2022-09-10 DIAGNOSIS — F32A Depression, unspecified: Secondary | ICD-10-CM | POA: Diagnosis not present

## 2022-09-10 DIAGNOSIS — F410 Panic disorder [episodic paroxysmal anxiety] without agoraphobia: Secondary | ICD-10-CM | POA: Diagnosis not present

## 2022-09-15 DIAGNOSIS — F32A Depression, unspecified: Secondary | ICD-10-CM | POA: Diagnosis not present

## 2022-09-15 DIAGNOSIS — F410 Panic disorder [episodic paroxysmal anxiety] without agoraphobia: Secondary | ICD-10-CM | POA: Diagnosis not present

## 2022-09-15 DIAGNOSIS — F411 Generalized anxiety disorder: Secondary | ICD-10-CM | POA: Diagnosis not present

## 2022-09-28 ENCOUNTER — Ambulatory Visit: Payer: BC Managed Care – PPO | Admitting: Internal Medicine

## 2022-09-29 DIAGNOSIS — F411 Generalized anxiety disorder: Secondary | ICD-10-CM | POA: Diagnosis not present

## 2022-09-29 DIAGNOSIS — F32A Depression, unspecified: Secondary | ICD-10-CM | POA: Diagnosis not present

## 2022-12-01 DIAGNOSIS — Z3202 Encounter for pregnancy test, result negative: Secondary | ICD-10-CM | POA: Diagnosis not present

## 2022-12-01 DIAGNOSIS — E669 Obesity, unspecified: Secondary | ICD-10-CM | POA: Diagnosis not present

## 2022-12-01 DIAGNOSIS — M5489 Other dorsalgia: Secondary | ICD-10-CM | POA: Diagnosis not present

## 2022-12-01 DIAGNOSIS — M2550 Pain in unspecified joint: Secondary | ICD-10-CM | POA: Diagnosis not present

## 2022-12-01 DIAGNOSIS — R5383 Other fatigue: Secondary | ICD-10-CM | POA: Diagnosis not present

## 2022-12-08 DIAGNOSIS — Z713 Dietary counseling and surveillance: Secondary | ICD-10-CM | POA: Diagnosis not present

## 2022-12-08 DIAGNOSIS — Z723 Lack of physical exercise: Secondary | ICD-10-CM | POA: Diagnosis not present

## 2022-12-08 DIAGNOSIS — Z724 Inappropriate diet and eating habits: Secondary | ICD-10-CM | POA: Diagnosis not present

## 2022-12-19 DIAGNOSIS — R5383 Other fatigue: Secondary | ICD-10-CM | POA: Diagnosis not present

## 2022-12-19 DIAGNOSIS — R632 Polyphagia: Secondary | ICD-10-CM | POA: Diagnosis not present

## 2022-12-19 DIAGNOSIS — K5900 Constipation, unspecified: Secondary | ICD-10-CM | POA: Diagnosis not present

## 2022-12-19 DIAGNOSIS — E669 Obesity, unspecified: Secondary | ICD-10-CM | POA: Diagnosis not present

## 2022-12-26 DIAGNOSIS — M5489 Other dorsalgia: Secondary | ICD-10-CM | POA: Diagnosis not present

## 2022-12-26 DIAGNOSIS — R519 Headache, unspecified: Secondary | ICD-10-CM | POA: Diagnosis not present

## 2022-12-26 DIAGNOSIS — M2550 Pain in unspecified joint: Secondary | ICD-10-CM | POA: Diagnosis not present

## 2022-12-26 DIAGNOSIS — E669 Obesity, unspecified: Secondary | ICD-10-CM | POA: Diagnosis not present

## 2023-01-02 DIAGNOSIS — K5900 Constipation, unspecified: Secondary | ICD-10-CM | POA: Diagnosis not present

## 2023-01-02 DIAGNOSIS — E669 Obesity, unspecified: Secondary | ICD-10-CM | POA: Diagnosis not present

## 2023-01-02 DIAGNOSIS — M2550 Pain in unspecified joint: Secondary | ICD-10-CM | POA: Diagnosis not present

## 2023-01-02 DIAGNOSIS — R5383 Other fatigue: Secondary | ICD-10-CM | POA: Diagnosis not present

## 2023-01-07 DIAGNOSIS — R5383 Other fatigue: Secondary | ICD-10-CM | POA: Diagnosis not present

## 2023-01-07 DIAGNOSIS — M5489 Other dorsalgia: Secondary | ICD-10-CM | POA: Diagnosis not present

## 2023-01-07 DIAGNOSIS — K5900 Constipation, unspecified: Secondary | ICD-10-CM | POA: Diagnosis not present

## 2023-01-07 DIAGNOSIS — E669 Obesity, unspecified: Secondary | ICD-10-CM | POA: Diagnosis not present

## 2023-06-10 ENCOUNTER — Ambulatory Visit: Admitting: Physician Assistant

## 2023-07-02 ENCOUNTER — Encounter: Payer: Self-pay | Admitting: Physician Assistant

## 2023-07-02 ENCOUNTER — Ambulatory Visit: Admitting: Physician Assistant

## 2023-07-02 VITALS — BP 130/80 | HR 71 | Ht 66.0 in | Wt 237.0 lb

## 2023-07-02 DIAGNOSIS — R1310 Dysphagia, unspecified: Secondary | ICD-10-CM

## 2023-07-02 DIAGNOSIS — R0601 Orthopnea: Secondary | ICD-10-CM

## 2023-07-02 DIAGNOSIS — R0600 Dyspnea, unspecified: Secondary | ICD-10-CM

## 2023-07-02 DIAGNOSIS — Z8619 Personal history of other infectious and parasitic diseases: Secondary | ICD-10-CM

## 2023-07-02 DIAGNOSIS — K219 Gastro-esophageal reflux disease without esophagitis: Secondary | ICD-10-CM

## 2023-07-02 DIAGNOSIS — A048 Other specified bacterial intestinal infections: Secondary | ICD-10-CM

## 2023-07-02 DIAGNOSIS — K5900 Constipation, unspecified: Secondary | ICD-10-CM

## 2023-07-02 DIAGNOSIS — R0789 Other chest pain: Secondary | ICD-10-CM

## 2023-07-02 DIAGNOSIS — R1319 Other dysphagia: Secondary | ICD-10-CM

## 2023-07-02 MED ORDER — PANTOPRAZOLE SODIUM 40 MG PO TBEC: 40.0000 mg | DELAYED_RELEASE_TABLET | Freq: Every day | ORAL | 0 refills | Status: DC

## 2023-07-02 NOTE — Progress Notes (Unsigned)
 07/02/2023 Alexandra Castillo [id number] 06/10/1985  Referring provider: No ref. provider found Primary GI doctor: Dr. Rosaline Coma  ASSESSMENT AND PLAN:  GERD with history of H. Pylori, now having dysphagia with chicken/bread and non cardiac chest discomfort + 04/2021 treated with quadruple therapy 04/19/2023 ER visit for atypical chest pain, pending echo negative troponin normal EKG Never did eradication study No ETOH, no NSAIDS, no tobacco use Not on PPI - start on pantoprazole  40 mg once daily -alginate therapy given -Lifestyle changes discussed, avoid NSAIDS, ETOH, hand out given to the patient -Schedule EGD at [facility] with possible diliation, patient does not want to wait a month, will schedule next week with Dr. Venice Gillis to evaluate H pylori, GERD, esophagitis, hiatal hernia. I discussed risks of EGD with patient today, including risk of sedation, bleeding or perforation. Patient provides understanding and gave verbal consent to proceed. - consider RUQ US  and HIDA pending results  Constipation Amitiza  too expensive, linzess  too expensive -Check with your insurance about what is on formulary - Increase fiber/ water intake, decrease caffeine, increase activity level. -Will add on Miralax daily  Orthopnea/DOE EKG normal 06/09/2023 EF 60=65, normal valves Continue with sleep study  Patient Care Team: Patient, No Pcp Per as PCP - General (General Practice) Fields, Quinton Buckler, MD (Inactive) (Gastroenterology)  HISTORY OF PRESENT ILLNESS: 38 y.o. female with a past medical history listed below presents for evaluation of non cardiac chest pain and dysphagia.   Previously seen 06/18/2021 for constipation GERD with history of H. pylori infection.  Discussed the use of AI scribe software for clinical note transcription with the patient, who gave verbal consent to proceed.  History of Present Illness   Alexandra Castillo is a 38 year old female who presents with gastrointestinal symptoms and  anxiety.  She has a history of H. pylori infection, which was treated previously, leading to an improvement in her symptoms. However, she did not return for an eradication study to confirm the infection was fully resolved. She is experiencing significant gas and is not currently taking pantoprazole  as her prescription ran out.  She has undergone an EKG and echocardiogram due to concerns about a possible heart attack, but results were normal. She has not yet completed a sleep study, although she experiences symptoms such as waking up tired, nocturnal urination, and daytime fatigue.  She reports increased anxiety and depression over the past three months, for which she is currently taking Latuda. She experiences nausea upon waking and persistent fatigue despite adequate sleep. She also reports occasional trouble swallowing, particularly with solid foods like chicken, and sometimes experiences dark stools.  She has a history of heavy menstrual periods but is not currently anemic. She experiences constipation and has tried various treatments including Miralax, lactulose , and fiber supplements, but these have not been effective. She has not been able to afford Linzess  or Amitiza  due to insurance coverage issues.  No family history of gallbladder issues. She does not consume alcohol or smoke. She is not currently taking any NSAIDs or iron supplements.        She  reports that she has never smoked. She has never used smokeless tobacco. She reports current alcohol use. She reports that she does not use drugs.  RELEVANT GI HISTORY, IMAGING AND LABS: Results   LABS H. pylori: positive Hemoglobin: within normal range (04/2023)  DIAGNOSTIC Echocardiogram: Normal ejection fraction, normal valves (06/09/2023) EKG: Normal      CBC    Component Value Date/Time   WBC  7.5 07/22/2022 0929   RBC 4.23 07/22/2022 0929   HGB 13.1 07/22/2022 0929   HCT 40.0 07/22/2022 0929   PLT 228 07/22/2022 0929   MCV  94.6 07/22/2022 0929   MCH 31.0 07/22/2022 0929   MCHC 32.8 07/22/2022 0929   RDW 13.4 07/22/2022 0929   LYMPHSABS 1.6 04/22/2021 1522   MONOABS 0.5 04/22/2021 1522   EOSABS 0.1 04/22/2021 1522   BASOSABS 0.1 04/22/2021 1522   Recent Labs    07/22/22 0929  HGB 13.1    CMP     Component Value Date/Time   NA 138 07/22/2022 0929   K 4.2 07/22/2022 0929   CL 102 07/22/2022 0929   CO2 25 07/22/2022 0929   GLUCOSE 95 07/22/2022 0929   BUN 11 07/22/2022 0929   CREATININE 0.77 07/22/2022 0929   CALCIUM 8.8 (L) 07/22/2022 0929   PROT 7.0 04/22/2021 1522   ALBUMIN 4.2 04/22/2021 1522   AST 13 04/22/2021 1522   ALT 11 04/22/2021 1522   ALKPHOS 76 04/22/2021 1522   BILITOT 0.5 04/22/2021 1522   GFRNONAA >60 07/22/2022 0929   GFRAA >60 05/01/2019 2327      Latest Ref Rng & Units 04/22/2021    3:22 PM 05/01/2019   11:27 PM  Hepatic Function  Total Protein 6.0 - 8.3 g/dL 7.0  6.7   Albumin 3.5 - 5.2 g/dL 4.2  3.7   AST 0 - 37 U/L 13  22   ALT 0 - 35 U/L 11  23   Alk Phosphatase 39 - 117 U/L 76  97   Total Bilirubin 0.2 - 1.2 mg/dL 0.5  0.5       Current Medications:    Current Outpatient Medications (Cardiovascular):    chlorthalidone  (HYGROTON ) 25 MG tablet, Take 1 tablet (25 mg total) by mouth daily. (Patient not taking: Reported on 07/02/2023)     Current Outpatient Medications (Other):    linaclotide  (LINZESS ) 290 MCG CAPS capsule, Take 1 capsule (290 mcg total) by mouth daily before breakfast. (Patient not taking: Reported on 07/02/2023)   Lurasidone HCl 60 MG TABS, SMARTSIG:1 Tablet(s) By Mouth Every Evening   pantoprazole  (PROTONIX ) 40 MG tablet, Take 1 tablet (40 mg total) by mouth daily.  Medical History:  Past Medical History:  Diagnosis Date   Constipation    GERD (gastroesophageal reflux disease)    H. pylori infection    Allergies:  Allergies  Allergen Reactions   Hydrocodeine [Dihydrocodeine] Itching   Latex      Surgical History:  She  has a  past surgical history that includes None. Family History:  Her family history includes Heart failure in her mother; Kidney failure in her mother.  REVIEW OF SYSTEMS  : All other systems reviewed and negative except where noted in the History of Present Illness.  PHYSICAL EXAM: BP 130/80   Pulse 71   Ht 5' 6 (1.676 m)   Wt 237 lb (107.5 kg)   BMI 38.25 kg/m  Physical Exam   GENERAL APPEARANCE: Well nourished, in no apparent distress. HEENT: No cervical lymphadenopathy, unremarkable thyroid , sclerae anicteric, conjunctiva pink. RESPIRATORY: Respiratory effort normal, breath sounds equal bilaterally without rales, rhonchi, or wheezing. Lungs clear to auscultation bilaterally. CARDIO: Regular rate and rhythm with no murmurs, rubs, or gallops. Peripheral pulses intact. ABDOMEN: Soft, non-distended, active bowel sounds in all four quadrants, non-tender to palpation, no rebound tenderness, no mass appreciated. RECTAL: Declines examination. MUSCULOSKELETAL: Full range of motion, normal gait, without edema. SKIN: Dry, intact  without rashes or lesions. No jaundice. NEURO: Alert, oriented, no focal deficits. PSYCH: Cooperative, normal mood and affect.      Edmonia Gottron, PA-C 3:37 PM

## 2023-07-02 NOTE — Patient Instructions (Addendum)
 CALL AND ASK YOUR INSURANCE ABOUT CONSTIPATION MEDICATIONS ON THEIR FORMULARY, YOU HAVE FAILED MIRALAX/STIMULANTS/FIBER/LACTULOSE   Please take your proton pump inhibitor medication, PANTOPRAZOLE  40 MG ONCE A DAY  Please take this medication 30 minutes to 1 hour before meals- this makes it more effective.  Avoid spicy and acidic foods Avoid fatty foods Limit your intake of coffee, tea, alcohol, and carbonated drinks Work to maintain a healthy weight Keep the head of the bed elevated at least 3 inches with blocks or a wedge pillow if you are having any nighttime symptoms Stay upright for 2 hours after eating Avoid meals and snacks three to four hours before bedtime  Reflux Gourmet Rescue  It is an ALGINATE THERAPY which is the only intervention that works to safeguard the esophagus by creating a protective barrier that actually stops reflux from happening. -The general directions for use are as stated on the packaging: Take 1 teaspoon (5 ml), or more as needed or as directed by your physician, after meals and before bed. -These general directions address the most common times for reflux to occur, but our Rescue products may be taken anytime. Some individuals may take our product preemptively, when they know they will suffer from reflux, or as needed - when discomfort arises. (If taken around food, it should be consumed last.) -You do not have to take 1 teaspoon (5 ml) of the product. While one teaspoon (5ml) may be the perfect average amount to relieve reflux suffering in some, others may require more or less. You may adjust the amount of Mint Chocolate Rescue and Vanilla Caramel Rescue to the lowest amount necessary to meet your individual needs to improve your quality of life. -You may dilute the product if it is too viscous for you to consume. Keep in mind, however, that the thickness of the product was formulated to provide optimal coating and protection of your throat and esophagus. Though  diluting the product is possible, it may reduce the protective function and/or length of action. -This can be used in conjunction with reflux medications and lifestyle changes.  100% ALL-NATURAL  Paraben FREE, glycerin FREE, & potassium FREE  Made entirely from all-natural ingredients considered safe for children and during pregnancy  No known side effects  All-natural flavor Gluten FREE  Allergen FREE  Vegan  Can find more information here: NameSeizer.co.nz   Toileting tips to help with your constipation - Drink at least 64-80 ounces of water/liquid per day. - Establish a time to try to move your bowels every day.  For many people, this is after a cup of coffee or after a meal such as breakfast. - Sit all of the way back on the toilet keeping your back fairly straight and while sitting up, try to rest the tops of your forearms on your upper thighs.   - Raising your feet with a step stool/squatty potty can be helpful to improve the angle that allows your stool to pass through the rectum. - Relax the rectum feeling it bulge toward the toilet water.  If you feel your rectum raising toward your body, you are contracting rather than relaxing. - Breathe in and slowly exhale. Belly breath by expanding your belly towards your belly button. Keep belly expanded as you gently direct pressure down and back to the anus.  A low pitched GRRR sound can assist with increasing intra-abdominal pressure.  (Can also trying to blow on a pinwheel and make it move, this helps with the same belly breathing) - Repeat  3-4 times. If unsuccessful, contract the pelvic floor to restore normal tone and get off the toilet.  Avoid excessive straining. - To reduce excessive wiping by teaching your anus to normally contract, place hands on outer aspect of knees and resist knee movement outward.  Hold 5-10 second then place hands just inside of knees and resist inward movement of knees.   Hold 5 seconds.  Repeat a few times each way.  Go to the ER if unable to pass gas, severe AB pain, unable to hold down food, any shortness of breath of chest pain.  Miralax is an osmotic laxative.  It only brings more water into the stool.  This is safe to take daily.  Can take up to 17 gram of miralax twice a day.  Mix with juice or coffee.  Start 1 capful at night for 3-4 days and reassess your response in 3-4 days.  You can increase and decrease the dose based on your response.  Remember, it can take up to 3-4 days to take effect OR for the effects to wear off.   I often pair this with benefiber in the morning to help assure the stool is not too loose.   Please follow up sooner if symptoms increase or worsen  Due to recent changes in healthcare laws, you may see the results of your imaging and laboratory studies on MyChart before your provider has had a chance to review them.  We understand that in some cases there may be results that are confusing or concerning to you. Not all laboratory results come back in the same time frame and the provider may be waiting for multiple results in order to interpret others.  Please give us  48 hours in order for your provider to thoroughly review all the results before contacting the office for clarification of your results.   Thank you for trusting me with your gastrointestinal care!   Santina Cull, PA-C _______________________________________________________  If your blood pressure at your visit was 140/90 or greater, please contact your primary care physician to follow up on this.  _______________________________________________________  If you are age 75 or older, your body mass index should be between 23-30. Your Body mass index is 38.25 kg/m. If this is out of the aforementioned range listed, please consider follow up with your Primary Care Provider.  If you are age 48 or younger, your body mass index should be between 19-25. Your Body  mass index is 38.25 kg/m. If this is out of the aformentioned range listed, please consider follow up with your Primary Care Provider.   ________________________________________________________  The [facility] providers would like to encourage you to use MYCHART to communicate with providers for non-urgent requests or questions.  Due to long hold times on the telephone, sending your provider a message by [facility] may be a faster and more efficient way to get a response.  Please allow 48 business hours for a response.  Please remember that this is for non-urgent requests.  _______________________________________________________.

## 2023-07-05 NOTE — Progress Notes (Signed)
 I agree with the assessment and plan as outlined by Ms. Steffanie Dunn.

## 2023-07-08 ENCOUNTER — Ambulatory Visit (AMBULATORY_SURGERY_CENTER): Admitting: Gastroenterology

## 2023-07-08 ENCOUNTER — Encounter: Payer: Self-pay | Admitting: Gastroenterology

## 2023-07-08 VITALS — BP 123/69 | HR 77 | Temp 98.1°F | Resp 12 | Ht 66.0 in | Wt 237.0 lb

## 2023-07-08 DIAGNOSIS — K2950 Unspecified chronic gastritis without bleeding: Secondary | ICD-10-CM | POA: Diagnosis not present

## 2023-07-08 DIAGNOSIS — R1319 Other dysphagia: Secondary | ICD-10-CM

## 2023-07-08 DIAGNOSIS — K2289 Other specified disease of esophagus: Secondary | ICD-10-CM | POA: Diagnosis present

## 2023-07-08 DIAGNOSIS — B9681 Helicobacter pylori [H. pylori] as the cause of diseases classified elsewhere: Secondary | ICD-10-CM | POA: Diagnosis not present

## 2023-07-08 MED ORDER — SODIUM CHLORIDE 0.9 % IV SOLN
500.0000 mL | Freq: Once | INTRAVENOUS | Status: DC
Start: 2023-07-08 — End: 2023-07-08

## 2023-07-08 MED ORDER — PANTOPRAZOLE SODIUM 40 MG PO TBEC: 40.0000 mg | DELAYED_RELEASE_TABLET | Freq: Every day | ORAL | 3 refills | Status: AC

## 2023-07-08 NOTE — Op Note (Signed)
 [facility] Patient Name: Alexandra Castillo Procedure Date: 07/08/2023 7:01 AM MRN: [id number] Endoscopist: Lynnie Bring , MD, [id number] Age: 38 Referring MD:  Date of Birth: September 14, 1985 Gender: Female Account #: [id number] Procedure:                Upper GI endoscopy Indications:              Dysphagia. H/O HP 04/2021 Medicines:                Monitored Anesthesia Care Procedure:                Pre-Anesthesia Assessment:                           - Prior to the procedure, a History and Physical                            was performed, and patient medications and                            allergies were reviewed. The patient's tolerance of                            previous anesthesia was also reviewed. The risks                            and benefits of the procedure and the sedation                            options and risks were discussed with the patient.                            All questions were answered, and informed consent                            was obtained. Prior Anticoagulants: The patient has                            taken no anticoagulant or antiplatelet agents. ASA                            Grade Assessment: II - A patient with mild systemic                            disease. After reviewing the risks and benefits,                            the patient was deemed in satisfactory condition to                            undergo the procedure.                           After obtaining informed consent, the endoscope was  passed under direct vision. Throughout the                            procedure, the patient's blood pressure, pulse, and                            oxygen saturations were monitored continuously. The                            Olympus Scope [id number] was introduced through the                            mouth, and advanced to the second part of duodenum.                            The upper GI endoscopy  was accomplished without                            difficulty. The patient tolerated the procedure                            well. Scope In: Scope Out: Findings:                 No endoscopic abnormality was evident in the                            esophagus to explain the patient's complaint of                            dysphagia. It was decided, however, to proceed with                            dilation of the entire esophagus. The scope was                            withdrawn. Dilation was performed with a Maloney                            dilator with mild resistance at 52 Fr and 54 Fr.                            Biopsies were obtained from the proximal and distal                            esophagus with cold forceps for histology of                            suspected eosinophilic esophagitis.                           The Z-line was regular and was found 35 cm from the  incisors.                           Diffuse minimal inflammation characterized by                            erythema was found in the gastric body and in the                            gastric antrum. Biopsies were taken with a cold                            forceps for histology.                           The examined duodenum was normal.                           The exam was otherwise without abnormality. Complications:            No immediate complications. Estimated Blood Loss:     Estimated blood loss: none. Impression:               - No endoscopic esophageal abnormality to explain                            patient's dysphagia. Esophagus dilated. Dilated.                           - Z-line regular, 35 cm from the incisors.                           - Minimal Gastritis. Biopsied.                           - Normal examined duodenum.                           - The examination was otherwise normal.                           - Biopsies were taken with a cold forceps for                             evaluation of eosinophilic esophagitis. Recommendation:           - Patient has a contact number available for                            emergencies. The signs and symptoms of potential                            delayed complications were discussed with the                            patient. Return to normal activities tomorrow.  Written discharge instructions were provided to the                            patient.                           - Post dil diet.                           - Brochures regarding GERD.                           - Continue present medications including Protonix                             40 mg p.o. daily.                           - Await pathology results.                           - If still with problems, further workup by means                            of esophageal manometry and/or GES.                           - The findings and recommendations were discussed                            with the patient's family. Lynnie Bring, MD 07/08/2023 8:34:13 AM This report has been signed electronically.

## 2023-07-08 NOTE — Progress Notes (Signed)
 07/02/2023 Alexandra Castillo [id number] 1985/11/19   Referring provider: No ref. provider found Primary GI doctor: Dr. Federico   ASSESSMENT AND PLAN:  GERD with history of H. Pylori, now having dysphagia with chicken/bread and non cardiac chest discomfort + 04/2021 treated with quadruple therapy 04/19/2023 ER visit for atypical chest pain, pending echo negative troponin normal EKG Never did eradication study No ETOH, no NSAIDS, no tobacco use Not on PPI - start on pantoprazole  40 mg once daily -alginate therapy given -Lifestyle changes discussed, avoid NSAIDS, ETOH, hand out given to the patient -Schedule EGD at [facility] with possible diliation, patient does not want to wait a month, will schedule next week with Dr. Charlanne to evaluate H pylori, GERD, esophagitis, hiatal hernia. I discussed risks of EGD with patient today, including risk of sedation, bleeding or perforation. Patient provides understanding and gave verbal consent to proceed. - consider RUQ US  and HIDA pending results   Constipation Amitiza  too expensive, linzess  too expensive -Check with your insurance about what is on formulary - Increase fiber/ water intake, decrease caffeine, increase activity level. -Will add on Miralax daily   Orthopnea/DOE EKG normal 06/09/2023 EF 60=65, normal valves Continue with sleep study   Patient Care Team: Patient, No Pcp Per as PCP - General (General Practice) Fields, Margo CROME, MD (Inactive) (Gastroenterology)   HISTORY OF PRESENT ILLNESS: 39 y.o. female with a past medical history listed below presents for evaluation of non cardiac chest pain and dysphagia.    Previously seen 06/18/2021 for constipation GERD with history of H. pylori infection.   Discussed the use of AI scribe software for clinical note transcription with the patient, who gave verbal consent to proceed.   History of Present Illness   Alexandra Castillo is a 38 year old female who presents with gastrointestinal symptoms and  anxiety.   She has a history of H. pylori infection, which was treated previously, leading to an improvement in her symptoms. However, she did not return for an eradication study to confirm the infection was fully resolved. She is experiencing significant gas and is not currently taking pantoprazole  as her prescription ran out.   She has undergone an EKG and echocardiogram due to concerns about a possible heart attack, but results were normal. She has not yet completed a sleep study, although she experiences symptoms such as waking up tired, nocturnal urination, and daytime fatigue.   She reports increased anxiety and depression over the past three months, for which she is currently taking Latuda. She experiences nausea upon waking and persistent fatigue despite adequate sleep. She also reports occasional trouble swallowing, particularly with solid foods like chicken, and sometimes experiences dark stools.   She has a history of heavy menstrual periods but is not currently anemic. She experiences constipation and has tried various treatments including Miralax, lactulose , and fiber supplements, but these have not been effective. She has not been able to afford Linzess  or Amitiza  due to insurance coverage issues.   No family history of gallbladder issues. She does not consume alcohol or smoke. She is not currently taking any NSAIDs or iron supplements.           She  reports that she has never smoked. She has never used smokeless tobacco. She reports current alcohol use. She reports that she does not use drugs.   RELEVANT GI HISTORY, IMAGING AND LABS: Results   LABS H. pylori: positive Hemoglobin: within normal range (04/2023)   DIAGNOSTIC Echocardiogram: Normal ejection fraction, normal valves (  06/09/2023) EKG: Normal       CBC Labs (Brief)          Component Value Date/Time    WBC 7.5 07/22/2022 0929    RBC 4.23 07/22/2022 0929    HGB 13.1 07/22/2022 0929    HCT 40.0 07/22/2022  0929    PLT 228 07/22/2022 0929    MCV 94.6 07/22/2022 0929    MCH 31.0 07/22/2022 0929    MCHC 32.8 07/22/2022 0929    RDW 13.4 07/22/2022 0929    LYMPHSABS 1.6 04/22/2021 1522    MONOABS 0.5 04/22/2021 1522    EOSABS 0.1 04/22/2021 1522    BASOSABS 0.1 04/22/2021 1522      Recent Labs (within last 365 days)     Recent Labs    07/22/22 0929  HGB 13.1        CMP     Labs (Brief)          Component Value Date/Time    NA 138 07/22/2022 0929    K 4.2 07/22/2022 0929    CL 102 07/22/2022 0929    CO2 25 07/22/2022 0929    GLUCOSE 95 07/22/2022 0929    BUN 11 07/22/2022 0929    CREATININE 0.77 07/22/2022 0929    CALCIUM 8.8 (L) 07/22/2022 0929    PROT 7.0 04/22/2021 1522    ALBUMIN 4.2 04/22/2021 1522    AST 13 04/22/2021 1522    ALT 11 04/22/2021 1522    ALKPHOS 76 04/22/2021 1522    BILITOT 0.5 04/22/2021 1522    GFRNONAA >60 07/22/2022 0929    GFRAA >60 05/01/2019 2327          Latest Ref Rng & Units 04/22/2021    3:22 PM 05/01/2019   11:27 PM  Hepatic Function  Total Protein 6.0 - 8.3 g/dL 7.0  6.7   Albumin 3.5 - 5.2 g/dL 4.2  3.7   AST 0 - 37 U/L 13  22   ALT 0 - 35 U/L 11  23   Alk Phosphatase 39 - 117 U/L 76  97   Total Bilirubin 0.2 - 1.2 mg/dL 0.5  0.5       Current Medications:      Current Outpatient Medications (Cardiovascular):    chlorthalidone  (HYGROTON ) 25 MG tablet, Take 1 tablet (25 mg total) by mouth daily. (Patient not taking: Reported on 07/02/2023)         Current Outpatient Medications (Other):    linaclotide  (LINZESS ) 290 MCG CAPS capsule, Take 1 capsule (290 mcg total) by mouth daily before breakfast. (Patient not taking: Reported on 07/02/2023)   Lurasidone HCl 60 MG TABS, SMARTSIG:1 Tablet(s) By Mouth Every Evening   pantoprazole  (PROTONIX ) 40 MG tablet, Take 1 tablet (40 mg total) by mouth daily.   Medical History:      Past Medical History:  Diagnosis Date   Constipation     GERD (gastroesophageal reflux disease)     H.  pylori infection          Allergies:  Allergies      Allergies  Allergen Reactions   Hydrocodeine [Dihydrocodeine] Itching   Latex          Surgical History:  She  has a past surgical history that includes None. Family History:  Her family history includes Heart failure in her mother; Kidney failure in her mother.   REVIEW OF SYSTEMS  : All other systems reviewed and negative except where noted in the History of Present  Illness.   PHYSICAL EXAM: BP 130/80   Pulse 71   Ht 5' 6 (1.676 m)   Wt 237 lb (107.5 kg)   BMI 38.25 kg/m  Physical Exam   GENERAL APPEARANCE: Well nourished, in no apparent distress. HEENT: No cervical lymphadenopathy, unremarkable thyroid , sclerae anicteric, conjunctiva pink. RESPIRATORY: Respiratory effort normal, breath sounds equal bilaterally without rales, rhonchi, or wheezing. Lungs clear to auscultation bilaterally. CARDIO: Regular rate and rhythm with no murmurs, rubs, or gallops. Peripheral pulses intact. ABDOMEN: Soft, non-distended, active bowel sounds in all four quadrants, non-tender to palpation, no rebound tenderness, no mass appreciated. RECTAL: Declines examination. MUSCULOSKELETAL: Full range of motion, normal gait, without edema. SKIN: Dry, intact without rashes or lesions. No jaundice. NEURO: Alert, oriented, no focal deficits. PSYCH: Cooperative, normal mood and affect.       Alan JONELLE Coombs, PA-C   Attending physician's note   I have taken history, reviewed the chart and examined the patient. I performed a substantive portion of this encounter, including complete performance of at least one of the key components, in conjunction with the APP. I agree with the Advanced Practitioner's note, impression and recommendations.   For EGD with Bx/dil On protonix  40 QD   Anselm Bring, MD [facility] [phone]

## 2023-07-08 NOTE — Patient Instructions (Addendum)
 Handout provided on post dilation diet and GERD.  Please continue to take Protonix  40mg  daily - refills sent into pharmacy.    YOU HAD AN ENDOSCOPIC PROCEDURE TODAY AT THE [facility]:   Refer to the procedure report that was given to you for any specific questions about what was found during the examination.  If the procedure report does not answer your questions, please call your gastroenterologist to clarify.  If you requested that your care partner not be given the details of your procedure findings, then the procedure report has been included in a sealed envelope for you to review at your convenience later.  YOU SHOULD EXPECT: Some feelings of bloating in the abdomen. Passage of more gas than usual.  Walking can help get rid of the air that was put into your GI tract during the procedure and reduce the bloating. If you had a lower endoscopy (such as a colonoscopy or flexible sigmoidoscopy) you may notice spotting of blood in your stool or on the toilet paper. If you underwent a bowel prep for your procedure, you may not have a normal bowel movement for a few days.  Please Note:  You might notice some irritation and congestion in your nose or some drainage.  This is from the oxygen used during your procedure.  There is no need for concern and it should clear up in a day or so.  SYMPTOMS TO REPORT IMMEDIATELY:  Following upper endoscopy (EGD)  Vomiting of blood or coffee ground material  New chest pain or pain under the shoulder blades  Painful or persistently difficult swallowing  New shortness of breath  Fever of 100F or higher  Black, tarry-looking stools  For urgent or emergent issues, a gastroenterologist can be reached at any hour by calling [phone]. Do not use MyChart messaging for urgent concerns.    DIET:  Follow post dilation diet handout.  Drink plenty of fluids but you should avoid alcoholic beverages for 24 hours.  ACTIVITY:  You should plan to take it  easy for the rest of today and you should NOT DRIVE or use heavy machinery until tomorrow (because of the sedation medicines used during the test).    FOLLOW UP: Our staff will call the number listed on your records the next business day following your procedure.  We will call around 7:15- 8:00 am to check on you and address any questions or concerns that you may have regarding the information given to you following your procedure. If we do not reach you, we will leave a message.     If any biopsies were taken you will be contacted by phone or by letter within the next 1-3 weeks.  Please call us  at [phone] if you have not heard about the biopsies in 3 weeks.    SIGNATURES/CONFIDENTIALITY: You and/or your care partner have signed paperwork which will be entered into your electronic medical record.  These signatures attest to the fact that that the information above on your After Visit Summary has been reviewed and is understood.  Full responsibility of the confidentiality of this discharge information lies with you and/or your care-partner.

## 2023-07-08 NOTE — Progress Notes (Signed)
 Report to PACU, RN, vss, BBS= Clear.

## 2023-07-08 NOTE — Progress Notes (Signed)
 Called to room to assist during endoscopic procedure.  Patient ID and intended procedure confirmed with present staff. Received instructions for my participation in the procedure from the performing physician.

## 2023-07-12 LAB — SURGICAL PATHOLOGY

## 2023-07-14 ENCOUNTER — Ambulatory Visit: Payer: Self-pay | Admitting: Gastroenterology

## 2023-07-15 ENCOUNTER — Other Ambulatory Visit: Payer: Self-pay

## 2023-07-15 MED ORDER — BISMUTH SUBSALICYLATE 262 MG PO CHEW: 524.0000 mg | CHEWABLE_TABLET | Freq: Four times a day (QID) | ORAL | 0 refills | Status: AC

## 2023-07-15 MED ORDER — DOXYCYCLINE HYCLATE 100 MG PO CAPS: 100.0000 mg | ORAL_CAPSULE | Freq: Two times a day (BID) | ORAL | 0 refills | Status: AC

## 2023-07-15 MED ORDER — METRONIDAZOLE 500 MG PO TABS: 500.0000 mg | ORAL_TABLET | Freq: Two times a day (BID) | ORAL | 0 refills | Status: AC

## 2023-09-30 ENCOUNTER — Other Ambulatory Visit: Payer: Self-pay

## 2023-09-30 DIAGNOSIS — A048 Other specified bacterial intestinal infections: Secondary | ICD-10-CM

## 2023-10-07 NOTE — Telephone Encounter (Signed)
 Left detailed message for patient to come by & pick up stool kit.
